# Patient Record
Sex: Female | Born: 1960
Health system: Southern US, Community
[De-identification: ages and names within clinical notes are randomized; demographics above are authoritative.]

## PROBLEM LIST (undated history)

## (undated) DIAGNOSIS — Z820 Family history of epilepsy and other diseases of the nervous system: Secondary | ICD-10-CM

## (undated) DIAGNOSIS — R569 Unspecified convulsions: Secondary | ICD-10-CM

## (undated) HISTORY — PX: OTHER SURGICAL HISTORY: SHX169

## (undated) HISTORY — PX: ANKLE SURGERY: SHX546

---

## 2012-02-13 DIAGNOSIS — R5383 Other fatigue: Secondary | ICD-10-CM | POA: Diagnosis not present

## 2012-02-13 DIAGNOSIS — M7989 Other specified soft tissue disorders: Secondary | ICD-10-CM | POA: Diagnosis not present

## 2012-02-13 DIAGNOSIS — R7989 Other specified abnormal findings of blood chemistry: Secondary | ICD-10-CM | POA: Diagnosis not present

## 2012-02-16 DIAGNOSIS — R569 Unspecified convulsions: Secondary | ICD-10-CM | POA: Diagnosis not present

## 2014-01-30 DIAGNOSIS — R569 Unspecified convulsions: Secondary | ICD-10-CM | POA: Diagnosis not present

## 2014-01-30 DIAGNOSIS — G808 Other cerebral palsy: Secondary | ICD-10-CM | POA: Diagnosis not present

## 2014-02-01 DIAGNOSIS — M19079 Primary osteoarthritis, unspecified ankle and foot: Secondary | ICD-10-CM | POA: Diagnosis not present

## 2014-02-01 DIAGNOSIS — M659 Synovitis and tenosynovitis, unspecified: Secondary | ICD-10-CM | POA: Diagnosis not present

## 2014-02-01 DIAGNOSIS — R569 Unspecified convulsions: Secondary | ICD-10-CM | POA: Diagnosis not present

## 2014-02-02 DIAGNOSIS — M19079 Primary osteoarthritis, unspecified ankle and foot: Secondary | ICD-10-CM | POA: Diagnosis not present

## 2014-02-02 DIAGNOSIS — G809 Cerebral palsy, unspecified: Secondary | ICD-10-CM | POA: Diagnosis not present

## 2014-04-28 DIAGNOSIS — G40309 Generalized idiopathic epilepsy and epileptic syndromes, not intractable, without status epilepticus: Secondary | ICD-10-CM | POA: Diagnosis not present

## 2014-04-28 DIAGNOSIS — E871 Hypo-osmolality and hyponatremia: Secondary | ICD-10-CM | POA: Diagnosis not present

## 2014-04-28 DIAGNOSIS — R51 Headache: Secondary | ICD-10-CM | POA: Diagnosis not present

## 2014-04-28 DIAGNOSIS — R569 Unspecified convulsions: Secondary | ICD-10-CM | POA: Diagnosis not present

## 2014-04-28 DIAGNOSIS — R110 Nausea: Secondary | ICD-10-CM | POA: Diagnosis not present

## 2014-05-09 DIAGNOSIS — R569 Unspecified convulsions: Secondary | ICD-10-CM | POA: Diagnosis not present

## 2014-05-09 DIAGNOSIS — G809 Cerebral palsy, unspecified: Secondary | ICD-10-CM | POA: Diagnosis not present

## 2014-05-23 DIAGNOSIS — G40909 Epilepsy, unspecified, not intractable, without status epilepticus: Secondary | ICD-10-CM | POA: Diagnosis not present

## 2014-08-29 ENCOUNTER — Ambulatory Visit: Payer: Self-pay | Admitting: Internal Medicine

## 2014-08-29 DIAGNOSIS — M79632 Pain in left forearm: Secondary | ICD-10-CM | POA: Diagnosis not present

## 2014-08-29 DIAGNOSIS — S5292XA Unspecified fracture of left forearm, initial encounter for closed fracture: Secondary | ICD-10-CM | POA: Diagnosis not present

## 2014-08-29 DIAGNOSIS — Z8669 Personal history of other diseases of the nervous system and sense organs: Secondary | ICD-10-CM | POA: Diagnosis not present

## 2014-08-29 DIAGNOSIS — Z0001 Encounter for general adult medical examination with abnormal findings: Secondary | ICD-10-CM | POA: Diagnosis not present

## 2014-08-29 DIAGNOSIS — M7989 Other specified soft tissue disorders: Secondary | ICD-10-CM | POA: Diagnosis not present

## 2014-08-29 DIAGNOSIS — S59912A Unspecified injury of left forearm, initial encounter: Secondary | ICD-10-CM | POA: Diagnosis not present

## 2014-08-31 DIAGNOSIS — S5292XA Unspecified fracture of left forearm, initial encounter for closed fracture: Secondary | ICD-10-CM | POA: Diagnosis not present

## 2014-09-20 DIAGNOSIS — G40409 Other generalized epilepsy and epileptic syndromes, not intractable, without status epilepticus: Secondary | ICD-10-CM | POA: Diagnosis not present

## 2014-09-20 DIAGNOSIS — Z87898 Personal history of other specified conditions: Secondary | ICD-10-CM | POA: Diagnosis not present

## 2014-10-02 DIAGNOSIS — S5292XA Unspecified fracture of left forearm, initial encounter for closed fracture: Secondary | ICD-10-CM | POA: Diagnosis not present

## 2015-01-08 DIAGNOSIS — L255 Unspecified contact dermatitis due to plants, except food: Secondary | ICD-10-CM | POA: Diagnosis not present

## 2015-09-19 DIAGNOSIS — G40409 Other generalized epilepsy and epileptic syndromes, not intractable, without status epilepticus: Secondary | ICD-10-CM | POA: Diagnosis not present

## 2015-09-19 DIAGNOSIS — Z87898 Personal history of other specified conditions: Secondary | ICD-10-CM | POA: Diagnosis not present

## 2015-09-28 ENCOUNTER — Encounter: Payer: Self-pay | Admitting: Emergency Medicine

## 2015-09-28 ENCOUNTER — Emergency Department
Admission: EM | Admit: 2015-09-28 | Discharge: 2015-09-28 | Disposition: A | Discharge status: Home / Self Care | Payer: Medicare Other | Attending: Emergency Medicine | Admitting: Emergency Medicine

## 2015-09-28 DIAGNOSIS — L02411 Cutaneous abscess of right axilla: Secondary | ICD-10-CM | POA: Diagnosis not present

## 2015-09-28 DIAGNOSIS — L02412 Cutaneous abscess of left axilla: Secondary | ICD-10-CM | POA: Insufficient documentation

## 2015-09-28 HISTORY — DX: Unspecified convulsions: R56.9

## 2015-09-28 MED ORDER — SULFAMETHOXAZOLE-TRIMETHOPRIM 800-160 MG PO TABS: 1.0000 | ORAL_TABLET | Freq: Two times a day (BID) | ORAL | Status: DC

## 2015-09-28 MED ORDER — LIDOCAINE HCL (PF) 1 % IJ SOLN
5.0000 mL | Freq: Once | INTRAMUSCULAR | Status: AC
  Administered 2015-09-28: 5 mL

## 2015-09-28 MED ORDER — OXYCODONE-ACETAMINOPHEN 5-325 MG PO TABS: 1.0000 | ORAL_TABLET | ORAL | Status: DC | PRN

## 2015-09-28 MED ORDER — OXYCODONE-ACETAMINOPHEN 5-325 MG PO TABS
1.0000 | ORAL_TABLET | Freq: Once | ORAL | Status: AC
  Administered 2015-09-28: 1 via ORAL
  Filled 2015-09-28: qty 1

## 2015-09-28 MED ORDER — LIDOCAINE HCL (PF) 1 % IJ SOLN
INTRAMUSCULAR | Status: AC
  Administered 2015-09-28: 5 mL
  Filled 2015-09-28: qty 5

## 2015-09-28 NOTE — ED Notes (Signed)
abacesses under both arms.

## 2015-09-28 NOTE — Discharge Instructions (Signed)
Abscess An abscess (boil or furuncle) is an infected area on or under the skin. This area is filled with yellowish-white fluid (pus) and other material (debris). HOME CARE   Only take medicines as told by your doctor.  If you were given antibiotic medicine, take it as directed. Finish the medicine even if you start to feel better.  If gauze is used, follow your doctor's directions for changing the gauze.  To avoid spreading the infection:  Keep your abscess covered with a bandage.  Wash your hands well.  Do not share personal care items, towels, or whirlpools with others.  Avoid skin contact with others.  Keep your skin and clothes clean around the abscess.  Keep all doctor visits as told. GET HELP RIGHT AWAY IF:   You have more pain, puffiness (swelling), or redness in the wound site.  You have more fluid or blood coming from the wound site.  You have muscle aches, chills, or you feel sick.  You have a fever. MAKE SURE YOU:   Understand these instructions.  Will watch your condition.  Will get help right away if you are not doing well or get worse.   This information is not intended to replace advice given to you by your health care provider. Make sure you discuss any questions you have with your health care provider.   Document Released: 04/07/2008 Document Revised: 04/20/2012 Document Reviewed: 01/03/2012 Elsevier Interactive Patient Education [employer] Elsevier Inc[employer]    Return to the emergency room in 2 days for packing removal. Begin taking Percocet as needed for pain and take Bactrim DS twice a day every day until finished.

## 2015-09-28 NOTE — ED Provider Notes (Signed)
[facility] Emergency Department Provider Note ____________________________________________  Time seen: Approximately 12:00 PM  I have reviewed the triage vital signs and the nursing notes.   HISTORY  Chief Complaint Abscess  HPI Brittany Miles is a 54 y.o. female is here with complaint of abscesses under both arms. Patient states that they have gotten worse over the last several days and she is unable to bear the pain anymore. Patient has not been taking any over-the-counter medication for pain. She denies any previous abscesses prior to this. She denies any fever or chills. There's been no nausea or vomiting. Currently she rates her pain an 8 out of 10 which increases with movement of her arms.   Past Medical History  Diagnosis Date  . Seizures (HCC)     There are no active problems to display for this patient.   No past surgical history on file.  Current Outpatient Rx  Name  Route  Sig  Dispense  Refill  . oxyCODONE-acetaminophen (PERCOCET) 5-325 MG tablet   Oral   Take 1 tablet by mouth every 4 (four) hours as needed for severe pain.   20 tablet   0   . sulfamethoxazole-trimethoprim (BACTRIM DS,SEPTRA DS) 800-160 MG tablet   Oral   Take 1 tablet by mouth 2 (two) times daily.   20 tablet   0     Allergies Review of patient's allergies indicates no known allergies.  No family history on file.  Social History Social History  Substance Use Topics  . Smoking status: Never Smoker   . Smokeless tobacco: None  . Alcohol Use: No    Review of Systems Constitutional: No fever/chills Cardiovascular: Denies chest pain. Respiratory: Denies shortness of breath. Gastrointestinal:  No nausea, no vomiting.  Genitourinary: Negative for dysuria. Musculoskeletal: Negative for back pain. Skin: Positive for abscesses 2 Neurological: Negative for headaches, focal weakness or numbness.  10-point ROS otherwise  negative.  ____________________________________________   PHYSICAL EXAM:  VITAL SIGNS: ED Triage Vitals  Enc Vitals Group     BP 09/28/15 1107 112/73 mmHg     Pulse Rate 09/28/15 1107 73     Resp --      Temp 09/28/15 1107 97.8 F (36.6 C)     Temp Source 09/28/15 1107 Oral     SpO2 09/28/15 1107 100 %     Weight 09/28/15 1107 160 lb (72.576 kg)     Height 09/28/15 1107  (1.6 m)     Head Cir --      Peak Flow --      Pain Score 09/28/15 1118 8     Pain Loc --      Pain Edu? --      Excl. in GC? --     Constitutional: Alert and oriented. Well appearing and in no acute distress. Eyes: Conjunctivae are normal. PERRL. EOMI. Head: Atraumatic. Nose: No congestion/rhinnorhea. Neck: No stridor.   Cardiovascular: Normal rate, regular rhythm. Grossly normal heart sounds.  Good peripheral circulation. Respiratory: Normal respiratory effort.  No retractions. Lungs CTAB. Gastrointestinal: Soft and nontender. No distention. Musculoskeletal: No lower extremity tenderness nor edema.  No joint effusions. Neurologic:  Normal speech and language. No gross focal neurologic deficits are appreciated. No gait instability. Skin:  Skin is warm, dry and intact. Bilateral axilla with abscesses present. Marked tenderness on palpation. Left axilla has a pustule present with some extending cellulitis. Right axilla single fluctuant area approximately 2 cm. No active drainage noted. Psychiatric: Mood and affect  are normal. Speech and behavior are normal.  ____________________________________________   LABS (all labs ordered are listed, but only abnormal results are displayed)  Labs Reviewed - No data to display  PROCEDURES  Procedure(s) performed: INCISION AND DRAINAGE Performed by: Tommi Rumpshonda L Laiyla Slagel Consent: Verbal consent obtained. Risks and benefits: risks, benefits and alternatives were discussed Type: abscess  Body area:bilateral axillae ansthesia: local infiltration  Incision was  made with a scalpel.  Local anesthetic: lidocaine 1 % without epinephrine  Anesthetic total:  3.20ml  area probed Drainage: purulent  Drainage amount: Moderate purulent drainage from the right axilla. Minimal purulent drainage from the left axilla.   Packing material: 1/4 in iodoform gauze right axilla only  Patient tolerance: Patient tolerated the procedure well with no immediate complications.    Critical Care performed: No  ____________________________________________   INITIAL IMPRESSION / ASSESSMENT AND PLAN / ED COURSE  Pertinent labs & imaging results that were available during my care of the patient were reviewed by me and considered in my medical decision making (see chart for details).  Patient was given Percocet prior to procedure and tolerated both axilla well. Patient was discharged with prescription for Bactrim DS 1 twice a day for 10 days along with Percocet as needed for pain. She will return in 2 days for removal of packing from the right axilla. ____________________________________________   FINAL CLINICAL IMPRESSION(S) / ED DIAGNOSES  Final diagnoses:  Cutaneous abscess of right axilla  Cutaneous abscess of left axilla      Tommi RumpsRhonda L Ethylene Reznick, PA-C 09/28/15 1451  Phineas SemenGraydon Goodman, MD 09/28/15 1540

## 2015-09-30 ENCOUNTER — Emergency Department
Admission: EM | Admit: 2015-09-30 | Discharge: 2015-09-30 | Disposition: A | Discharge status: Home / Self Care | Payer: Medicare Other | Attending: Emergency Medicine | Admitting: Emergency Medicine

## 2015-09-30 ENCOUNTER — Encounter: Payer: Self-pay | Admitting: Emergency Medicine

## 2015-09-30 DIAGNOSIS — Z792 Long term (current) use of antibiotics: Secondary | ICD-10-CM | POA: Diagnosis not present

## 2015-09-30 DIAGNOSIS — L02411 Cutaneous abscess of right axilla: Secondary | ICD-10-CM | POA: Diagnosis not present

## 2015-09-30 DIAGNOSIS — Z4801 Encounter for change or removal of surgical wound dressing: Secondary | ICD-10-CM | POA: Diagnosis not present

## 2015-09-30 DIAGNOSIS — L02412 Cutaneous abscess of left axilla: Secondary | ICD-10-CM | POA: Insufficient documentation

## 2015-09-30 DIAGNOSIS — Z09 Encounter for follow-up examination after completed treatment for conditions other than malignant neoplasm: Secondary | ICD-10-CM

## 2015-09-30 DIAGNOSIS — Z880 Allergy status to penicillin: Secondary | ICD-10-CM | POA: Diagnosis not present

## 2015-09-30 DIAGNOSIS — L02419 Cutaneous abscess of limb, unspecified: Secondary | ICD-10-CM

## 2015-09-30 DIAGNOSIS — Z4800 Encounter for change or removal of nonsurgical wound dressing: Secondary | ICD-10-CM | POA: Diagnosis not present

## 2015-09-30 NOTE — ED Provider Notes (Signed)
[facility] Emergency Department Provider Note  ____________________________________________  Time seen: Approximately 10:45 AM  I have reviewed the triage vital signs and the nursing notes.   HISTORY  Chief Complaint Wound Check    HPI Brittany Miles is a 54 y.o. female who presents emergency department for I&D recheck.Patient was seen in the emergency department 2 days prior with I&D of abscesses in both axillas. Patient reports that she has been taking antibiotics as prescribed and reports improvement in symptoms. Patient states there is still mild drainage from both. She has kept areas covered with dressings. She denies any fevers or chills, nausea or vomiting, chest pain or shortness of breath.   Past Medical History  Diagnosis Date  . Seizures (HCC)     There are no active problems to display for this patient.   History reviewed. No pertinent past surgical history.  Current Outpatient Rx  Name  Route  Sig  Dispense  Refill  . oxyCODONE-acetaminophen (PERCOCET) 5-325 MG tablet   Oral   Take 1 tablet by mouth every 4 (four) hours as needed for severe pain.   20 tablet   0   . sulfamethoxazole-trimethoprim (BACTRIM DS,SEPTRA DS) 800-160 MG tablet   Oral   Take 1 tablet by mouth 2 (two) times daily.   20 tablet   0     Allergies Codeine and Penicillins  No family history on file.  Social History Social History  Substance Use Topics  . Smoking status: Never Smoker   . Smokeless tobacco: None  . Alcohol Use: No    Review of Systems Constitutional: No fever/chills Eyes: No visual changes. ENT: No sore throat. Cardiovascular: Denies chest pain. Respiratory: Denies shortness of breath. Gastrointestinal: No abdominal pain.  No nausea, no vomiting.  No diarrhea.  No constipation. Genitourinary: Negative for dysuria. Musculoskeletal: Negative for back pain. Skin: Negative for rash. Endorses abscesses bilateral axilla. Neurological:  Negative for headaches, focal weakness or numbness.  10-point ROS otherwise negative.  ____________________________________________   PHYSICAL EXAM:  VITAL SIGNS: ED Triage Vitals  Enc Vitals Group     BP 09/30/15 1026 105/70 mmHg     Pulse Rate 09/30/15 1026 77     Resp 09/30/15 1026 16     Temp 09/30/15 1026 98.4 F (36.9 C)     Temp Source 09/30/15 1026 Oral     SpO2 09/30/15 1026 99 %     Weight 09/30/15 1026 160 lb (72.576 kg)     Height 09/30/15 1026 5\' 2"  (1.575 m)     Head Cir --      Peak Flow --      Pain Score 09/30/15 1028 6     Pain Loc --      Pain Edu? --      Excl. in GC? --     Constitutional: Alert and oriented. Well appearing and in no acute distress. Eyes: Conjunctivae are normal. PERRL. EOMI. Head: Atraumatic. Nose: No congestion/rhinnorhea. Mouth/Throat: Mucous membranes are moist.  Oropharynx non-erythematous. Neck: No stridor.   Cardiovascular: Normal rate, regular rhythm. Grossly normal heart sounds.  Good peripheral circulation. Respiratory: Normal respiratory effort.  No retractions. Lungs CTAB. Gastrointestinal: Soft and nontender. No distention. No abdominal bruits. No CVA tenderness. Musculoskeletal: No lower extremity tenderness nor edema.  No joint effusions. Neurologic:  Normal speech and language. No gross focal neurologic deficits are appreciated. No gait instability. Skin:  Skin is warm, dry and intact. No rash noted. Incised and drained abscess in both  axillas. There is still edema and minimal erythema to right axilla. Packing is in place. Minimal purulent drainage noted on packing. Area is firm to palpation with no fluctuance. Patient reports minimal tenderness to palpation. No expressed pus to palpation. Left axilla reveals no packing. Minimal purulent drainage noted to the incision opening. Area is firm to palpation with no fluctuance. No expressed pus with palpation. Psychiatric: Mood and affect are normal. Speech and behavior are  normal.  ____________________________________________   LABS (all labs ordered are listed, but only abnormal results are displayed)  Labs Reviewed - No data to display ____________________________________________  EKG   ____________________________________________  RADIOLOGY   ____________________________________________   PROCEDURES  Procedure(s) performed: None  Critical Care performed: No  ____________________________________________   INITIAL IMPRESSION / ASSESSMENT AND PLAN / ED COURSE  Pertinent labs & imaging results that were available during my care of the patient were reviewed by me and considered in my medical decision making (see chart for details).  The patient presents emergency department for wound recheck from I&D's in this department 2 days prior. Patient is showing signs of improvement. There is were evaluated with no increase in symptoms. No increase in purulent drainage. Packing is removed from right and not replaced. Areas are covered with gauze and Tegaderm. Advised patient to continue to cover areas and take antibiotics. Patient verbalizes understanding of diagnosis and treatment plan and verbalizes compliance with same. ____________________________________________   FINAL CLINICAL IMPRESSION(S) / ED DIAGNOSES  Final diagnoses:  Encounter for recheck of abscess following incision and drainage  Axillary abscess      Racheal Patches, PA-C 09/30/15 1106  Phineas Semen, MD 09/30/15 1252

## 2015-09-30 NOTE — ED Notes (Signed)
Patient had I & D of abscess on both underarms done on Friday, here today for recheck. Patient denies any fevers or chills. Patient reports clear discharge from right incision area.

## 2015-09-30 NOTE — ED Notes (Signed)
Patient presents to the ED for a wound check after having abscess in both axillary areas lanced.  Patient is in no obvious distress at this time.  Patient reports continuing to have some pain in axilla but pain has improved somewhat since having areas lanced.

## 2015-09-30 NOTE — Discharge Instructions (Signed)
Incision and Drainage Incision and drainage is a procedure in which a sac-like structure (cystic structure) is opened and drained. The area to be drained usually contains material such as pus, fluid, or blood.  LET YOUR CAREGIVER KNOW ABOUT:   Allergies to medicine.  Medicines taken, including vitamins, herbs, eyedrops, over-the-counter medicines, and creams.  Use of steroids (by mouth or creams).  Previous problems with anesthetics or numbing medicines.  History of bleeding problems or blood clots.  Previous surgery.  Other health problems, including diabetes and kidney problems.  Possibility of pregnancy, if this applies. RISKS AND COMPLICATIONS  Pain.  Bleeding.  Scarring.  Infection. BEFORE THE PROCEDURE  You may need to have an ultrasound or other imaging tests to see how large or deep your cystic structure is. Blood tests may also be used to determine if you have an infection or how severe the infection is. You may need to have a tetanus shot. PROCEDURE  The affected area is cleaned with a cleaning fluid. The cyst area will then be numbed with a medicine (local anesthetic). A small incision will be made in the cystic structure. A syringe or catheter may be used to drain the contents of the cystic structure, or the contents may be squeezed out. The area will then be flushed with a cleansing solution. After cleansing the area, it is often gently packed with a gauze or another wound dressing. Once it is packed, it will be covered with gauze and tape or some other type of wound dressing. AFTER THE PROCEDURE   Often, you will be allowed to go home right after the procedure.  You may be given antibiotic medicine to prevent or heal an infection.  If the area was packed with gauze or some other wound dressing, you will likely need to come back in 1 to 2 days to get it removed.  The area should heal in about 14 days.   This information is not intended to replace advice given  to you by your health care provider. Make sure you discuss any questions you have with your health care provider.   Document Released: 04/15/2001 Document Revised: 04/20/2012 Document Reviewed: 12/15/2011 Elsevier Interactive Patient Education 2016 Elsevier Inc.  Wound Care Taking care of your wound properly can help to prevent pain and infection. It can also help your wound to heal more quickly.  HOW TO CARE FOR YOUR WOUND  Take or apply over-the-counter and prescription medicines only as told by your health care provider.  If you were prescribed antibiotic medicine, take or apply it as told by your health care provider. Do not stop using the antibiotic even if your condition improves.  Clean the wound each day or as told by your health care provider.  Wash the wound with mild soap and water.  Rinse the wound with water to remove all soap.  Pat the wound dry with a clean towel. Do not rub it.  There are many different ways to close and cover a wound. For example, a wound can be covered with stitches (sutures), skin glue, or adhesive strips. Follow instructions from your health care provider about:  How to take care of your wound.  When and how you should change your bandage (dressing).  When you should remove your dressing.  Removing whatever was used to close your wound.  Check your wound every day for signs of infection. Watch for:  Redness, swelling, or pain.  Fluid, blood, or pus.  Keep the dressing dry  until your health care provider says it can be removed. Do not take baths, swim, use a hot tub, or do anything that would put your wound underwater until your health care provider approves.  Raise (elevate) the injured area above the level of your heart while you are sitting or lying down.  Do not scratch or pick at the wound.  Keep all follow-up visits as told by your health care provider. This is important. SEEK MEDICAL CARE IF:  You received a tetanus shot and  you have swelling, severe pain, redness, or bleeding at the injection site.  You have a fever.  Your pain is not controlled with medicine.  You have increased redness, swelling, or pain at the site of your wound.  You have fluid, blood, or pus coming from your wound.  You notice a bad smell coming from your wound or your dressing. SEEK IMMEDIATE MEDICAL CARE IF:  You have a red streak going away from your wound.   This information is not intended to replace advice given to you by your health care provider. Make sure you discuss any questions you have with your health care provider.   Document Released: 07/29/2008 Document Revised: 03/06/2015 Document Reviewed: 10/16/2014 Elsevier Interactive Patient Education [employer]

## 2015-10-05 DIAGNOSIS — G40409 Other generalized epilepsy and epileptic syndromes, not intractable, without status epilepticus: Secondary | ICD-10-CM | POA: Diagnosis not present

## 2015-11-20 DIAGNOSIS — B9562 Methicillin resistant Staphylococcus aureus infection as the cause of diseases classified elsewhere: Secondary | ICD-10-CM | POA: Diagnosis not present

## 2015-11-20 DIAGNOSIS — L02419 Cutaneous abscess of limb, unspecified: Secondary | ICD-10-CM | POA: Diagnosis not present

## 2015-11-20 DIAGNOSIS — L0390 Cellulitis, unspecified: Secondary | ICD-10-CM | POA: Diagnosis not present

## 2015-11-20 DIAGNOSIS — G40909 Epilepsy, unspecified, not intractable, without status epilepticus: Secondary | ICD-10-CM | POA: Diagnosis not present

## 2015-11-27 DIAGNOSIS — B9562 Methicillin resistant Staphylococcus aureus infection as the cause of diseases classified elsewhere: Secondary | ICD-10-CM | POA: Diagnosis not present

## 2015-11-27 DIAGNOSIS — G809 Cerebral palsy, unspecified: Secondary | ICD-10-CM | POA: Diagnosis not present

## 2015-11-27 DIAGNOSIS — L0390 Cellulitis, unspecified: Secondary | ICD-10-CM | POA: Diagnosis not present

## 2015-12-11 DIAGNOSIS — Z8669 Personal history of other diseases of the nervous system and sense organs: Secondary | ICD-10-CM | POA: Diagnosis not present

## 2015-12-11 DIAGNOSIS — G40909 Epilepsy, unspecified, not intractable, without status epilepticus: Secondary | ICD-10-CM | POA: Diagnosis not present

## 2015-12-11 DIAGNOSIS — L0390 Cellulitis, unspecified: Secondary | ICD-10-CM | POA: Diagnosis not present

## 2015-12-11 DIAGNOSIS — B9562 Methicillin resistant Staphylococcus aureus infection as the cause of diseases classified elsewhere: Secondary | ICD-10-CM | POA: Diagnosis not present

## 2016-03-18 DIAGNOSIS — G40409 Other generalized epilepsy and epileptic syndromes, not intractable, without status epilepticus: Secondary | ICD-10-CM | POA: Diagnosis not present

## 2016-03-18 DIAGNOSIS — Z87898 Personal history of other specified conditions: Secondary | ICD-10-CM | POA: Diagnosis not present

## 2016-07-16 DIAGNOSIS — G40909 Epilepsy, unspecified, not intractable, without status epilepticus: Secondary | ICD-10-CM | POA: Diagnosis not present

## 2016-07-16 DIAGNOSIS — L0390 Cellulitis, unspecified: Secondary | ICD-10-CM | POA: Diagnosis not present

## 2016-07-16 DIAGNOSIS — Z8669 Personal history of other diseases of the nervous system and sense organs: Secondary | ICD-10-CM | POA: Diagnosis not present

## 2016-07-17 DIAGNOSIS — R5381 Other malaise: Secondary | ICD-10-CM | POA: Diagnosis not present

## 2016-07-17 DIAGNOSIS — E784 Other hyperlipidemia: Secondary | ICD-10-CM | POA: Diagnosis not present

## 2016-07-17 DIAGNOSIS — I10 Essential (primary) hypertension: Secondary | ICD-10-CM | POA: Diagnosis not present

## 2016-07-21 DIAGNOSIS — Z8669 Personal history of other diseases of the nervous system and sense organs: Secondary | ICD-10-CM | POA: Diagnosis not present

## 2016-07-21 DIAGNOSIS — G40909 Epilepsy, unspecified, not intractable, without status epilepticus: Secondary | ICD-10-CM | POA: Diagnosis not present

## 2016-07-21 DIAGNOSIS — R29818 Other symptoms and signs involving the nervous system: Secondary | ICD-10-CM | POA: Diagnosis not present

## 2016-10-20 DIAGNOSIS — G40909 Epilepsy, unspecified, not intractable, without status epilepticus: Secondary | ICD-10-CM | POA: Diagnosis not present

## 2016-10-20 DIAGNOSIS — Z8669 Personal history of other diseases of the nervous system and sense organs: Secondary | ICD-10-CM | POA: Diagnosis not present

## 2016-10-22 ENCOUNTER — Other Ambulatory Visit
Admission: RE | Admit: 2016-10-22 | Discharge: 2016-10-22 | Disposition: A | Discharge status: Home / Self Care | Payer: Medicare Other | Source: Ambulatory Visit | Attending: Internal Medicine | Admitting: Internal Medicine

## 2016-10-22 DIAGNOSIS — G909 Disorder of the autonomic nervous system, unspecified: Secondary | ICD-10-CM | POA: Insufficient documentation

## 2016-10-22 DIAGNOSIS — G809 Cerebral palsy, unspecified: Secondary | ICD-10-CM | POA: Diagnosis present

## 2016-10-22 DIAGNOSIS — G40901 Epilepsy, unspecified, not intractable, with status epilepticus: Secondary | ICD-10-CM | POA: Diagnosis not present

## 2016-10-22 LAB — LIPID PANEL
Cholesterol: 227 mg/dL — ABNORMAL HIGH (ref 0–200)
HDL: 90 mg/dL (ref >40)
LDL Cholesterol: 130 mg/dL — ABNORMAL HIGH (ref 0–99)
Total CHOL/HDL Ratio: 2.5 RATIO
Triglycerides: 34 mg/dL (ref <150)
VLDL: 7 mg/dL (ref 0–40)

## 2016-10-22 LAB — BASIC METABOLIC PANEL
Anion gap: 8 (ref 5–15)
BUN: 10 mg/dL (ref 6–20)
CO2: 26 mmol/L (ref 22–32)
Calcium: 9 mg/dL (ref 8.9–10.3)
Chloride: 96 mmol/L — ABNORMAL LOW (ref 101–111)
Creatinine, Ser: 0.63 mg/dL (ref 0.44–1.00)
GFR calc Af Amer: >60 mL/min (ref >60)
GFR calc non Af Amer: >60 mL/min (ref >60)
Glucose, Bld: 86 mg/dL (ref 65–99)
Potassium: 4 mmol/L (ref 3.5–5.1)
Sodium: 130 mmol/L — ABNORMAL LOW (ref 135–145)

## 2016-10-22 LAB — CBC
HCT: 36.6 % (ref 35.0–47.0)
Hemoglobin: 12.6 g/dL (ref 12.0–16.0)
MCH: 29.6 pg (ref 26.0–34.0)
MCHC: 34.4 g/dL (ref 32.0–36.0)
MCV: 86 fL (ref 80.0–100.0)
Platelets: 273 10*3/uL (ref 150–440)
RBC: 4.26 MIL/uL (ref 3.80–5.20)
RDW: 13.2 % (ref 11.5–14.5)
WBC: 3.6 10*3/uL (ref 3.6–11.0)

## 2016-10-22 LAB — TSH: TSH: 2.234 u[IU]/mL (ref 0.350–4.500)

## 2016-11-04 DIAGNOSIS — G40909 Epilepsy, unspecified, not intractable, without status epilepticus: Secondary | ICD-10-CM | POA: Diagnosis not present

## 2016-11-04 DIAGNOSIS — Z8669 Personal history of other diseases of the nervous system and sense organs: Secondary | ICD-10-CM | POA: Diagnosis not present

## 2016-11-04 DIAGNOSIS — S5292XA Unspecified fracture of left forearm, initial encounter for closed fracture: Secondary | ICD-10-CM | POA: Diagnosis not present

## 2016-11-04 DIAGNOSIS — R29818 Other symptoms and signs involving the nervous system: Secondary | ICD-10-CM | POA: Diagnosis not present

## 2017-03-04 DIAGNOSIS — Z8669 Personal history of other diseases of the nervous system and sense organs: Secondary | ICD-10-CM | POA: Diagnosis not present

## 2017-03-04 DIAGNOSIS — S5292XA Unspecified fracture of left forearm, initial encounter for closed fracture: Secondary | ICD-10-CM | POA: Diagnosis not present

## 2017-03-04 DIAGNOSIS — Z885 Allergy status to narcotic agent status: Secondary | ICD-10-CM | POA: Diagnosis not present

## 2017-03-04 DIAGNOSIS — R29818 Other symptoms and signs involving the nervous system: Secondary | ICD-10-CM | POA: Diagnosis not present

## 2017-10-01 DIAGNOSIS — R29818 Other symptoms and signs involving the nervous system: Secondary | ICD-10-CM | POA: Diagnosis not present

## 2017-10-01 DIAGNOSIS — G40909 Epilepsy, unspecified, not intractable, without status epilepticus: Secondary | ICD-10-CM | POA: Diagnosis not present

## 2017-10-01 DIAGNOSIS — Z8669 Personal history of other diseases of the nervous system and sense organs: Secondary | ICD-10-CM | POA: Diagnosis not present

## 2017-10-01 DIAGNOSIS — I889 Nonspecific lymphadenitis, unspecified: Secondary | ICD-10-CM | POA: Diagnosis not present

## 2017-10-07 DIAGNOSIS — R569 Unspecified convulsions: Secondary | ICD-10-CM | POA: Diagnosis not present

## 2017-10-15 DIAGNOSIS — Z8669 Personal history of other diseases of the nervous system and sense organs: Secondary | ICD-10-CM | POA: Diagnosis not present

## 2017-10-15 DIAGNOSIS — R29818 Other symptoms and signs involving the nervous system: Secondary | ICD-10-CM | POA: Diagnosis not present

## 2017-10-15 DIAGNOSIS — G40909 Epilepsy, unspecified, not intractable, without status epilepticus: Secondary | ICD-10-CM | POA: Diagnosis not present

## 2017-10-15 DIAGNOSIS — I889 Nonspecific lymphadenitis, unspecified: Secondary | ICD-10-CM | POA: Diagnosis not present

## 2018-09-03 DIAGNOSIS — R29818 Other symptoms and signs involving the nervous system: Secondary | ICD-10-CM | POA: Diagnosis not present

## 2018-09-03 DIAGNOSIS — Z8669 Personal history of other diseases of the nervous system and sense organs: Secondary | ICD-10-CM | POA: Diagnosis not present

## 2018-09-03 DIAGNOSIS — Z0000 Encounter for general adult medical examination without abnormal findings: Secondary | ICD-10-CM | POA: Diagnosis not present

## 2018-09-03 DIAGNOSIS — G40909 Epilepsy, unspecified, not intractable, without status epilepticus: Secondary | ICD-10-CM | POA: Diagnosis not present

## 2018-09-03 DIAGNOSIS — I889 Nonspecific lymphadenitis, unspecified: Secondary | ICD-10-CM | POA: Diagnosis not present

## 2018-09-08 ENCOUNTER — Other Ambulatory Visit: Payer: Self-pay

## 2018-09-08 DIAGNOSIS — Z1211 Encounter for screening for malignant neoplasm of colon: Secondary | ICD-10-CM

## 2018-09-09 ENCOUNTER — Other Ambulatory Visit: Payer: Self-pay | Admitting: Internal Medicine

## 2018-09-09 DIAGNOSIS — Z1231 Encounter for screening mammogram for malignant neoplasm of breast: Secondary | ICD-10-CM

## 2018-09-20 ENCOUNTER — Encounter: Payer: Self-pay | Admitting: *Deleted

## 2018-09-21 ENCOUNTER — Encounter: Payer: Self-pay | Admitting: *Deleted

## 2018-09-21 ENCOUNTER — Ambulatory Visit: Payer: Medicare Other | Admitting: Anesthesiology

## 2018-09-21 ENCOUNTER — Ambulatory Visit
Admission: RE | Admit: 2018-09-21 | Discharge: 2018-09-21 | Disposition: A | Discharge status: Home / Self Care | Payer: Medicare Other | Source: Ambulatory Visit | Attending: Gastroenterology | Admitting: Gastroenterology

## 2018-09-21 ENCOUNTER — Encounter
Admission: RE | Disposition: A | Discharge status: Home / Self Care | Payer: Self-pay | Source: Ambulatory Visit | Attending: Gastroenterology

## 2018-09-21 DIAGNOSIS — Z820 Family history of epilepsy and other diseases of the nervous system: Secondary | ICD-10-CM | POA: Insufficient documentation

## 2018-09-21 DIAGNOSIS — Z79899 Other long term (current) drug therapy: Secondary | ICD-10-CM | POA: Diagnosis not present

## 2018-09-21 DIAGNOSIS — Z880 Allergy status to penicillin: Secondary | ICD-10-CM | POA: Diagnosis not present

## 2018-09-21 DIAGNOSIS — Z885 Allergy status to narcotic agent status: Secondary | ICD-10-CM | POA: Insufficient documentation

## 2018-09-21 DIAGNOSIS — Z1211 Encounter for screening for malignant neoplasm of colon: Secondary | ICD-10-CM | POA: Diagnosis not present

## 2018-09-21 DIAGNOSIS — R569 Unspecified convulsions: Secondary | ICD-10-CM | POA: Diagnosis not present

## 2018-09-21 DIAGNOSIS — E669 Obesity, unspecified: Secondary | ICD-10-CM | POA: Diagnosis not present

## 2018-09-21 DIAGNOSIS — Z6832 Body mass index (BMI) 32.0-32.9, adult: Secondary | ICD-10-CM | POA: Diagnosis not present

## 2018-09-21 HISTORY — PX: COLONOSCOPY WITH PROPOFOL: SHX5780

## 2018-09-21 HISTORY — DX: Family history of epilepsy and other diseases of the nervous system: Z82.0

## 2018-09-21 SURGERY — COLONOSCOPY WITH PROPOFOL
Anesthesia: General

## 2018-09-21 MED ORDER — PROPOFOL 10 MG/ML IV BOLUS
INTRAVENOUS | Status: DC | PRN
  Administered 2018-09-21: 50 mg via INTRAVENOUS

## 2018-09-21 MED ORDER — GLYCOPYRROLATE 0.2 MG/ML IJ SOLN
INTRAMUSCULAR | Status: DC | PRN
  Administered 2018-09-21: 0.2 mg via INTRAVENOUS

## 2018-09-21 MED ORDER — PROPOFOL 500 MG/50ML IV EMUL
INTRAVENOUS | Status: AC
  Filled 2018-09-21: qty 50

## 2018-09-21 MED ORDER — SODIUM CHLORIDE 0.9 % IV SOLN: INTRAVENOUS | Status: DC

## 2018-09-21 MED ORDER — LIDOCAINE HCL (PF) 2 % IJ SOLN
INTRAMUSCULAR | Status: DC | PRN
  Administered 2018-09-21: 60 mg via INTRADERMAL

## 2018-09-21 MED ORDER — PROPOFOL 500 MG/50ML IV EMUL
INTRAVENOUS | Status: DC | PRN
  Administered 2018-09-21: 200 ug/kg/min via INTRAVENOUS

## 2018-09-21 MED ORDER — LACTATED RINGERS IV SOLN
INTRAVENOUS | Status: DC | PRN
  Administered 2018-09-21: 14:00:00 via INTRAVENOUS

## 2018-09-21 NOTE — H&P (Signed)
Arlyss Repressohini R Vanga, MD [address]  [address]  [place], [place] [address]  Main: [phone]  Fax: [phone] Pager: [phone]  Primary Care Physician:  Corky DownsMasoud, Javed, MD Primary Gastroenterologist:  Dr. Arlyss Repressohini R Vanga  Pre-Procedure History & Physical: HPI:  [name] is a 57 y.o. female is here for an colonoscopy.   Past Medical History:  Diagnosis Date  . FH: cerebral palsy   . Seizures (HCC)     Past Surgical History:  Procedure Laterality Date  . dog bite      Prior to Admission medications   Medication Sig Start Date End Date Taking? Authorizing Provider  carbamazepine (TEGRETOL) 200 MG tablet Take 200 mg by mouth 3 (three) times daily.   Yes [provider]  gabapentin (NEURONTIN) 800 MG tablet Take 800 mg by mouth 3 (three) times daily.   Yes [provider]  oxyCODONE-acetaminophen (PERCOCET) 5-325 MG tablet Take 1 tablet by mouth every 4 (four) hours as needed for severe pain. 09/28/15   Tommi RumpsSummers, Rhonda L, PA-C  sulfamethoxazole-trimethoprim (BACTRIM DS,SEPTRA DS) 800-160 MG tablet Take 1 tablet by mouth 2 (two) times daily. 09/28/15   Tommi RumpsSummers, Rhonda L, PA-C    Allergies as of 09/08/2018 - Review Complete 09/28/2015  Allergen Reaction Noted  . Codeine Nausea And Vomiting 09/30/2015  . Penicillins Nausea And Vomiting 09/30/2015    History reviewed. No pertinent family history.  Social History   Socioeconomic History  . Marital status: Single    Spouse name: Not on file  . Number of children: Not on file  . Years of education: Not on file  . Highest education level: Not on file  Occupational History  . Not on file  Social Needs  . Financial resource strain: Not on file  . Food insecurity:    Worry: Not on file    Inability: Not on file  . Transportation needs:    Medical: Not on file    Non-medical: Not on file  Tobacco Use  . Smoking status: Never Smoker  . Smokeless tobacco: Never Used  Substance and Sexual  Activity  . Alcohol use: No  . Drug use: Never  . Sexual activity: Not on file  Lifestyle  . Physical activity:    Days per week: Not on file    Minutes per session: Not on file  . Stress: Not on file  Relationships  . Social connections:    Talks on phone: Not on file    Gets together: Not on file    Attends religious service: Not on file    Active member of club or organization: Not on file    Attends meetings of clubs or organizations: Not on file    Relationship status: Not on file  . Intimate partner violence:    Fear of current or ex partner: Not on file    Emotionally abused: Not on file    Physically abused: Not on file    Forced sexual activity: Not on file  Other Topics Concern  . Not on file  Social History Narrative  . Not on file    Review of Systems: See HPI, otherwise negative ROS  Physical Exam: BP 132/79   Pulse 72   Temp 98.2 F (36.8 C) (Tympanic)   Resp 18   Ht 5\' 2"  (1.575 m)   Wt 81.6 kg   SpO2 100%   BMI 32.92 kg/m  General:   Alert,  pleasant and cooperative in NAD Head:  Normocephalic and  atraumatic. Neck:  Supple; no masses or thyromegaly. Lungs:  Clear throughout to auscultation.    Heart:  Regular rate and rhythm. Abdomen:  Soft, nontender and nondistended. Normal bowel sounds, without guarding, and without rebound.   Neurologic:  Alert and  oriented x4;  grossly normal neurologically.  Impression/Plan: Brittany Miles is here for an colonoscopy to be performed for colon cancer screening  Risks, benefits, limitations, and alternatives regarding  colonoscopy have been reviewed with the patient.  Questions have been answered.  All parties agreeable.   [name], MD  09/21/2018, 1:18 PM

## 2018-09-21 NOTE — Op Note (Addendum)
[facility] Patient Name: Brittany Miles Procedure Date: 09/21/2018 2:12 PM MRN: [id number] Account #: [id number] Date of Birth: 1961-05-02 Admit Type: Outpatient Age: 57 Room: [facility] ENDO ROOM 4 Gender: Female Note Status: Finalized Procedure:            Colonoscopy Indications:          Screening for colorectal malignant neoplasm, This is                        the patient's first colonoscopy Providers:            Lin Landsman MD, MD Referring MD:         Cletis Athens, MD (Referring MD) Medicines:            Monitored Anesthesia Care Complications:        No immediate complications. Estimated blood loss: None. Procedure:            Pre-Anesthesia Assessment:                       - Prior to the procedure, a History and Physical was                        performed, and patient medications and allergies were                        reviewed. The patient is competent. The risks and                        benefits of the procedure and the sedation options and                        risks were discussed with the patient. All questions                        were answered and informed consent was obtained.                        Patient identification and proposed procedure were                        verified by the physician, the nurse, the                        anesthesiologist, the anesthetist and the technician in                        the pre-procedure area in the procedure room in the                        endoscopy suite. Mental Status Examination: alert and                        oriented. Airway Examination: normal oropharyngeal                        airway and neck mobility. Respiratory Examination:                        clear to auscultation. CV Examination: normal.  Prophylactic Antibiotics: The patient does not require                        prophylactic antibiotics. Prior Anticoagulants: The                         patient has taken no previous anticoagulant or                        antiplatelet agents. ASA Grade Assessment: II - A                        patient with mild systemic disease. After reviewing the                        risks and benefits, the patient was deemed in                        satisfactory condition to undergo the procedure. The                        anesthesia plan was to use monitored anesthesia care                        (MAC). Immediately prior to administration of                        medications, the patient was re-assessed for adequacy                        to receive sedatives. The heart rate, respiratory rate,                        oxygen saturations, blood pressure, adequacy of                        pulmonary ventilation, and response to care were                        monitored throughout the procedure. The physical status                        of the patient was re-assessed after the procedure.                       After obtaining informed consent, the colonoscope was                        passed under direct vision. Throughout the procedure,                        the patient's blood pressure, pulse, and oxygen                        saturations were monitored continuously. The                        Colonoscope was introduced through the anus and  advanced to the the terminal ileum, with identification                        of the appendiceal orifice and IC valve. The                        colonoscopy was performed without difficulty. The                        patient tolerated the procedure well. The quality of                        the bowel preparation was evaluated using the BBPS                        [facility] Bowel Preparation Scale) with scores of: Right                        Colon = 3, Transverse Colon = 3 and Left Colon = 3                        (entire mucosa seen well with no residual staining,                         small fragments of stool or opaque liquid). The total                        BBPS score equals 9. Findings:      The perianal and digital rectal examinations were normal. Pertinent       negatives include normal sphincter tone and no palpable rectal lesions.      The terminal ileum appeared normal.      The colon (entire examined portion) appeared normal.      The retroflexed view of the distal rectum and anal verge was normal and       showed no anal or rectal abnormalities. Impression:           - The examined portion of the ileum was normal.                       - The entire examined colon is normal.                       - The distal rectum and anal verge are normal on                        retroflexion view.                       - No specimens collected. Recommendation:       - Discharge patient to home (with escort).                       - Resume previous diet today.                       - Continue present medications.                       - Repeat colonoscopy in 10 years for surveillance. Procedure  Code(s):    --- Professional ---                       Y5486, Colorectal cancer screening; colonoscopy on                        individual not meeting criteria for high risk Diagnosis Code(s):    --- Professional ---                       Z12.11, Encounter for screening for malignant neoplasm                        of colon CPT copyright 2018 American Medical Association. All rights reserved. The codes documented in this report are preliminary and upon coder review may  be revised to meet current compliance requirements. Dr. Ulyess Mort Lin Landsman MD, MD 09/21/2018 2:31:29 PM This report has been signed electronically. Number of Addenda: 0 Note Initiated On: 09/21/2018 2:12 PM Scope Withdrawal Time: 0 hours 9 minutes 59 seconds  Total Procedure Duration: 0 hours 12 minutes 16 seconds       [facility]

## 2018-09-21 NOTE — Anesthesia Preprocedure Evaluation (Signed)
Anesthesia Evaluation  Patient identified by MRN, date of birth, ID band Patient awake    Reviewed: Allergy & Precautions, NPO status , Patient's Chart, lab work & pertinent test results  History of Anesthesia Complications Negative for: history of anesthetic complications  Airway Mallampati: II  TM Distance: >3 FB Neck ROM: Full    Dental no notable dental hx.    Pulmonary neg pulmonary ROS, neg sleep apnea, neg COPD,    breath sounds clear to auscultation- rhonchi (-) wheezing      Cardiovascular Exercise Tolerance: Good (-) hypertension(-) CAD, (-) Past MI, (-) Cardiac Stents and (-) CABG  Rhythm:Regular Rate:Normal - Systolic murmurs and - Diastolic murmurs    Neuro/Psych Seizures -, Well Controlled,  negative psych ROS   GI/Hepatic negative GI ROS, Neg liver ROS,   Endo/Other  negative endocrine ROSneg diabetes  Renal/GU negative Renal ROS     Musculoskeletal negative musculoskeletal ROS (+)   Abdominal (+) + obese,   Peds  Hematology negative hematology ROS (+)   Anesthesia Other Findings Past Medical History: No date: FH: cerebral palsy No date: Seizures (HCC)   Reproductive/Obstetrics                             Anesthesia Physical Anesthesia Plan  ASA: II  Anesthesia Plan: General   Post-op Pain Management:    Induction: Intravenous  PONV Risk Score and Plan: 2 and Propofol infusion  Airway Management Planned: Natural Airway  Additional Equipment:   Intra-op Plan:   Post-operative Plan:   Informed Consent: I have reviewed the patients History and Physical, chart, labs and discussed the procedure including the risks, benefits and alternatives for the proposed anesthesia with the patient or authorized representative who has indicated his/her understanding and acceptance.   Dental advisory given  Plan Discussed with: CRNA and Anesthesiologist  Anesthesia Plan  Comments:         Anesthesia Quick Evaluation

## 2018-09-21 NOTE — Anesthesia Postprocedure Evaluation (Signed)
Anesthesia Post Note  Patient: Brittany SectionCarolyn Miles  Procedure(s) Performed: COLONOSCOPY WITH PROPOFOL (N/A )  Patient location during evaluation: Endoscopy Anesthesia Type: General Level of consciousness: awake and alert and oriented Pain management: pain level controlled Vital Signs Assessment: post-procedure vital signs reviewed and stable Respiratory status: spontaneous breathing, nonlabored ventilation and respiratory function stable Cardiovascular status: blood pressure returned to baseline and stable Postop Assessment: no signs of nausea or vomiting Anesthetic complications: no     Last Vitals:  Vitals:   09/21/18 1434 09/21/18 1444  BP: 125/78 (!) 143/89  Pulse: 85 82  Resp: 20 (!) 23  Temp: (!) 36.2 C   SpO2: 100% 100%    Last Pain:  Vitals:   09/21/18 1444  TempSrc:   PainSc: 0-No pain                 Stellar Gensel

## 2018-09-21 NOTE — Transfer of Care (Signed)
Immediate Anesthesia Transfer of Care Note  Patient: Brittany Miles  Procedure(s) Performed: COLONOSCOPY WITH PROPOFOL (N/A )  Patient Location: PACU  Anesthesia Type:MAC  Level of Consciousness: awake, alert , oriented and patient cooperative  Airway & Oxygen Therapy: Patient Spontanous Breathing and Patient connected to nasal cannula oxygen  Post-op Assessment: Report given to RN and Post -op Vital signs reviewed and stable  Post vital signs: Reviewed and stable  Last Vitals:  Vitals Value Taken Time  BP    Temp    Pulse    Resp    SpO2      Last Pain:  Vitals:   09/21/18 1247  TempSrc: Tympanic  PainSc: 0-No pain      Patients Stated Pain Goal: 0 (69/67/89 3810)  Complications: No apparent anesthesia complications

## 2018-09-21 NOTE — Anesthesia Post-op Follow-up Note (Signed)
Anesthesia QCDR form completed.        

## 2018-09-23 ENCOUNTER — Encounter: Payer: Self-pay | Admitting: Gastroenterology

## 2018-10-07 ENCOUNTER — Ambulatory Visit
Admission: RE | Admit: 2018-10-07 | Discharge: 2018-10-07 | Disposition: A | Discharge status: Home / Self Care | Payer: Medicare Other | Source: Ambulatory Visit | Attending: Internal Medicine | Admitting: Internal Medicine

## 2018-10-07 DIAGNOSIS — Z1231 Encounter for screening mammogram for malignant neoplasm of breast: Secondary | ICD-10-CM | POA: Diagnosis not present

## 2019-01-27 ENCOUNTER — Emergency Department
Admission: EM | Admit: 2019-01-27 | Discharge: 2019-01-27 | Disposition: A | Discharge status: Home / Self Care | Payer: Medicare Other | Attending: Emergency Medicine | Admitting: Emergency Medicine

## 2019-01-27 ENCOUNTER — Encounter: Payer: Self-pay | Admitting: Emergency Medicine

## 2019-01-27 ENCOUNTER — Other Ambulatory Visit: Payer: Self-pay

## 2019-01-27 DIAGNOSIS — B9789 Other viral agents as the cause of diseases classified elsewhere: Secondary | ICD-10-CM | POA: Insufficient documentation

## 2019-01-27 DIAGNOSIS — J069 Acute upper respiratory infection, unspecified: Secondary | ICD-10-CM | POA: Diagnosis not present

## 2019-01-27 DIAGNOSIS — R05 Cough: Secondary | ICD-10-CM | POA: Diagnosis not present

## 2019-01-27 MED ORDER — PSEUDOEPH-BROMPHEN-DM 30-2-10 MG/5ML PO SYRP: 10.0000 mL | ORAL_SOLUTION | Freq: Four times a day (QID) | ORAL | 0 refills | Status: DC | PRN

## 2019-01-27 MED ORDER — CETIRIZINE HCL 10 MG PO CAPS: 10.0000 mg | ORAL_CAPSULE | Freq: Every day | ORAL | 3 refills | Status: DC

## 2019-01-27 NOTE — ED Provider Notes (Signed)
[facility] Emergency Department Provider Note  ____________________________________________  Time seen: Approximately 12:22 PM  I have reviewed the triage vital signs and the nursing notes.   HISTORY  Chief Complaint Cough   HPI Brittany Miles is a 58 y.o. female who presents to the emergency department for treatment and evaluation of cough for the past week with fatigue.  She has a history of bronchitis.  She states that she typically gets these symptoms every year.  No alleviating measures attempted prior to arrival  Past Medical History:  Diagnosis Date  . FH: cerebral palsy   . Seizures [facility])     Patient Active Problem List   Diagnosis Date Noted  . Encounter for screening colonoscopy     Past Surgical History:  Procedure Laterality Date  . COLONOSCOPY WITH PROPOFOL N/A 09/21/2018   Procedure: COLONOSCOPY WITH PROPOFOL;  Surgeon: Toney Reil, MD;  Location: [facility] ENDOSCOPY;  Service: Gastroenterology;  Laterality: N/A;  . dog bite      Prior to Admission medications   Medication Sig Start Date End Date Taking? Authorizing Provider  brompheniramine-pseudoephedrine-DM 30-2-10 MG/5ML syrup Take 10 mLs by mouth 4 (four) times daily as needed. 01/27/19   Ruthe Roemer, Rulon Eisenmenger B, FNP  carbamazepine (TEGRETOL) 200 MG tablet Take 200 mg by mouth 3 (three) times daily.    [provider]  Cetirizine HCl 10 MG CAPS Take 1 capsule (10 mg total) by mouth daily. 01/27/19   Amiri Riechers B, FNP  gabapentin (NEURONTIN) 800 MG tablet Take 800 mg by mouth 3 (three) times daily.    [provider]  oxyCODONE-acetaminophen (PERCOCET) 5-325 MG tablet Take 1 tablet by mouth every 4 (four) hours as needed for severe pain. 09/28/15   Tommi Rumps, PA-C  sulfamethoxazole-trimethoprim (BACTRIM DS,SEPTRA DS) 800-160 MG tablet Take 1 tablet by mouth 2 (two) times daily. 09/28/15   Tommi Rumps, PA-C    Allergies Codeine and  Penicillins  Family History  Problem Relation Age of Onset  . Breast cancer Neg Hx     Social History Social History   Tobacco Use  . Smoking status: Never Smoker  . Smokeless tobacco: Never Used  Substance Use Topics  . Alcohol use: No  . Drug use: Never    Review of Systems Constitutional: Negative for fever/chills.  Normal appetite. ENT: No sore throat. Cardiovascular: Denies chest pain. Respiratory: Negative for shortness of breath.  Positive for cough.  No wheezing.  Gastrointestinal: Negative for nausea, no vomiting.  Negative for diarrhea.  Musculoskeletal: Negative for body aches Skin: Negative for rash. Neurological: Negative for headaches ____________________________________________   PHYSICAL EXAM:  VITAL SIGNS: BP 123/74, temp 98.2, pulse rate 65, respiratory rate 18, SPO2 100% on room air. ED Triage Vitals  Enc Vitals Group     BP      Pulse      Resp      Temp      Temp src      SpO2      Weight      Height      Head Circumference      Peak Flow      Pain Score      Pain Loc      Pain Edu?      Excl. in GC?     Constitutional: Alert and oriented.  Well appearing and in no acute distress. Eyes: Conjunctivae are normal. Nose: No sinus congestion noted; no rhinnorhea. Mouth/Throat: Mucous membranes are moist.  Neck: No stridor.  Lymphatic: No cervical lymphadenopathy. Cardiovascular: Normal rate, regular rhythm. Good peripheral circulation. Respiratory: Respirations are even and unlabored.  No retractions.  Breath sounds clear to auscultation. Gastrointestinal: Soft and nontender.  Musculoskeletal: Ambulatory with unassisted gait Neurologic:  Normal speech and language. Skin:  Skin is warm, dry and intact. No rash noted. Psychiatric: Mood and affect are normal. Speech and behavior are normal.  ____________________________________________   LABS (all labs ordered are listed, but only abnormal results are displayed)  Labs Reviewed - No  data to display ____________________________________________  EKG  Not indicated ____________________________________________  RADIOLOGY  Not indicated ____________________________________________   PROCEDURES  Procedure(s) performed: None  Critical Care performed: No ____________________________________________   INITIAL IMPRESSION / ASSESSMENT AND PLAN / ED COURSE  58 y.o. female who presents to the emergency department for treatment and evaluation of cough and fatigue.  Patient's vital signs are stable and within normal limits including having no fever and no tachycardia.  PERC negative.  Low risk for ACS.  No worrisome travel or sick contacts that would suggest an increased risk of COVID-19.  This patient does not meet criteria for testing currently and I explained that in detail.  The work-up today is reassuring with no evidence of emergent medical condition that requires further work-up or evaluation or inpatient treatment.  I gave my usual and customary follow-up recommendations and return precautions and the patient understands and agrees with the plan.    Medications - No data to display  ED Discharge Orders         Ordered    Cetirizine HCl 10 MG CAPS  Daily     01/27/19 1248    brompheniramine-pseudoephedrine-DM 30-2-10 MG/5ML syrup  4 times daily PRN     01/27/19 1248           Pertinent labs & imaging results that were available during my care of the patient were reviewed by me and considered in my medical decision making (see chart for details).    If controlled substance prescribed during this visit, 12 month history viewed on the [facility] prior to issuing an initial prescription for Schedule II or III opiod. ____________________________________________   FINAL CLINICAL IMPRESSION(S) / ED DIAGNOSES  Final diagnoses:  Viral URI with cough    Note:  This document was prepared using Dragon voice recognition software and may include unintentional  dictation errors.    Chinita Pester, FNP 01/27/19 1500    Don Perking [place], MD 01/29/19 1539

## 2019-01-27 NOTE — ED Triage Notes (Signed)
Pt presents to ED via POV with c/o dry cough x 1 week with fatigue. Pt states "I usually get this cough around this time every year". Pt states hx of bronchitis.

## 2019-06-21 DIAGNOSIS — I10 Essential (primary) hypertension: Secondary | ICD-10-CM | POA: Diagnosis not present

## 2019-06-21 DIAGNOSIS — R5381 Other malaise: Secondary | ICD-10-CM | POA: Diagnosis not present

## 2019-06-21 DIAGNOSIS — G809 Cerebral palsy, unspecified: Secondary | ICD-10-CM | POA: Diagnosis not present

## 2019-06-21 DIAGNOSIS — Z885 Allergy status to narcotic agent status: Secondary | ICD-10-CM | POA: Diagnosis not present

## 2019-06-21 DIAGNOSIS — Z23 Encounter for immunization: Secondary | ICD-10-CM | POA: Diagnosis not present

## 2019-06-21 DIAGNOSIS — R29818 Other symptoms and signs involving the nervous system: Secondary | ICD-10-CM | POA: Diagnosis not present

## 2019-06-30 DIAGNOSIS — S5292XA Unspecified fracture of left forearm, initial encounter for closed fracture: Secondary | ICD-10-CM | POA: Diagnosis not present

## 2019-06-30 DIAGNOSIS — R29818 Other symptoms and signs involving the nervous system: Secondary | ICD-10-CM | POA: Diagnosis not present

## 2019-10-13 ENCOUNTER — Other Ambulatory Visit: Payer: Self-pay

## 2019-10-13 NOTE — Patient Outreach (Signed)
[facility]) Care Management  10/13/2019  Brittany Miles 1960-12-07 [id number]   Medication Adherence call to Mrs. Brittany Miles [facility] Identifiers Verify spoke with patient she is past due on Atorvastatin 20 mg ,patient explain she has stop the medication because she was having side effects. Brittany Miles is showing past due under [place].  [facility] Direct Dial [phone]  Fax [phone] [email or web address]

## 2019-12-19 DIAGNOSIS — G809 Cerebral palsy, unspecified: Secondary | ICD-10-CM | POA: Diagnosis not present

## 2019-12-19 DIAGNOSIS — R29818 Other symptoms and signs involving the nervous system: Secondary | ICD-10-CM | POA: Diagnosis not present

## 2020-02-20 DIAGNOSIS — Z0000 Encounter for general adult medical examination without abnormal findings: Secondary | ICD-10-CM | POA: Diagnosis not present

## 2020-02-20 DIAGNOSIS — R29818 Other symptoms and signs involving the nervous system: Secondary | ICD-10-CM | POA: Diagnosis not present

## 2020-02-20 DIAGNOSIS — M7989 Other specified soft tissue disorders: Secondary | ICD-10-CM | POA: Diagnosis not present

## 2020-07-05 ENCOUNTER — Encounter: Payer: Self-pay | Admitting: Internal Medicine

## 2020-07-05 ENCOUNTER — Ambulatory Visit (INDEPENDENT_AMBULATORY_CARE_PROVIDER_SITE_OTHER): Payer: Medicare Other | Admitting: Internal Medicine

## 2020-07-05 ENCOUNTER — Other Ambulatory Visit: Payer: Self-pay

## 2020-07-05 VITALS — BP 142/87 | HR 69 | Ht 63.0 in | Wt 193.9 lb

## 2020-07-05 DIAGNOSIS — R609 Edema, unspecified: Secondary | ICD-10-CM

## 2020-07-05 DIAGNOSIS — R252 Cramp and spasm: Secondary | ICD-10-CM

## 2020-07-05 DIAGNOSIS — Z0000 Encounter for general adult medical examination without abnormal findings: Secondary | ICD-10-CM

## 2020-07-05 DIAGNOSIS — E8881 Metabolic syndrome: Secondary | ICD-10-CM

## 2020-07-05 NOTE — Progress Notes (Signed)
Established Patient Office Visit  SUBJECTIVE:  Subjective  Patient ID: Brittany Miles, female    DOB: [birth year]  Age: 59 y.o. MRN: [id number]  CC:  Chief Complaint  Patient presents with  . Annual Exam    HPI Brittany Miles is a 59 y.o. female presenting today for an annual exam.   She notes that she fell recently and she hurt her left knee. She states that since then, she has been getting charlie horses in her left leg.   She has no other complaints.    Past Medical History:  Diagnosis Date  . FH: cerebral palsy   . Seizures (HCC)     Past Surgical History:  Procedure Laterality Date  . COLONOSCOPY WITH PROPOFOL N/A 09/21/2018   Procedure: COLONOSCOPY WITH PROPOFOL;  Surgeon: Toney Reil, MD;  Location: [facility] ENDOSCOPY;  Service: Gastroenterology;  Laterality: N/A;  . dog bite      Family History  Problem Relation Age of Onset  . Breast cancer Neg Hx     Social History   Socioeconomic History  . Marital status: Single    Spouse name: Not on file  . Number of children: Not on file  . Years of education: Not on file  . Highest education level: Not on file  Occupational History  . Not on file  Tobacco Use  . Smoking status: Never Smoker  . Smokeless tobacco: Never Used  Substance and Sexual Activity  . Alcohol use: No  . Drug use: Never  . Sexual activity: Not on file  Other Topics Concern  . Not on file  Social History Narrative  . Not on file   Social Determinants of Health   Financial Resource Strain:   . Difficulty of Paying Living Expenses: Not on file  Food Insecurity:   . Worried About Programme researcher, broadcasting/film/video in the Last Year: Not on file  . Ran Out of Food in the Last Year: Not on file  Transportation Needs:   . Lack of Transportation (Medical): Not on file  . Lack of Transportation (Non-Medical): Not on file  Physical Activity:   . Days of Exercise per Week: Not on file  . Minutes of Exercise per Session: Not on file  Stress:   .  Feeling of Stress : Not on file  Social Connections:   . Frequency of Communication with Friends and Family: Not on file  . Frequency of Social Gatherings with Friends and Family: Not on file  . Attends Religious Services: Not on file  . Active Member of Clubs or Organizations: Not on file  . Attends Banker Meetings: Not on file  . Marital Status: Not on file  Intimate Partner Violence:   . Fear of Current or Ex-Partner: Not on file  . Emotionally Abused: Not on file  . Physically Abused: Not on file  . Sexually Abused: Not on file     Current Outpatient Medications:  .  brompheniramine-pseudoephedrine-DM 30-2-10 MG/5ML syrup, Take 10 mLs by mouth 4 (four) times daily as needed., Disp: 120 mL, Rfl: 0 .  carbamazepine (TEGRETOL) 200 MG tablet, Take 200 mg by mouth 3 (three) times daily., Disp: , Rfl:  .  Cetirizine HCl 10 MG CAPS, Take 1 capsule (10 mg total) by mouth daily., Disp: 30 capsule, Rfl: 3 .  gabapentin (NEURONTIN) 800 MG tablet, Take 800 mg by mouth 3 (three) times daily., Disp: , Rfl:  .  oxyCODONE-acetaminophen (PERCOCET) 5-325 MG tablet, Take 1 tablet by  mouth every 4 (four) hours as needed for severe pain., Disp: 20 tablet, Rfl: 0 .  sulfamethoxazole-trimethoprim (BACTRIM DS,SEPTRA DS) 800-160 MG tablet, Take 1 tablet by mouth 2 (two) times daily., Disp: 20 tablet, Rfl: 0   Allergies  Allergen Reactions  . Codeine Nausea And Vomiting  . Penicillins Nausea And Vomiting    ROS Review of Systems  Constitutional: Negative.   HENT: Negative.  Negative for trouble swallowing.   Eyes: Negative.   Respiratory: Negative.  Negative for cough, chest tightness and shortness of breath.   Cardiovascular: Negative.  Negative for chest pain and palpitations.  Gastrointestinal: Negative.   Endocrine: Negative.   Genitourinary: Negative.   Musculoskeletal: Negative.   Skin: Negative.   Allergic/Immunologic: Negative.   Neurological: Positive for weakness  (cerebral palsy).  Hematological: Negative.   Psychiatric/Behavioral: Negative.   All other systems reviewed and are negative.    OBJECTIVE:    Physical Exam Vitals reviewed.  Constitutional:      Appearance: Normal appearance.  HENT:     Mouth/Throat:     Mouth: Mucous membranes are moist.  Eyes:     Pupils: Pupils are equal, round, and reactive to light.  Neck:     Vascular: No carotid bruit.  Cardiovascular:     Rate and Rhythm: Normal rate and regular rhythm.     Pulses: Normal pulses.     Heart sounds: Normal heart sounds.  Pulmonary:     Effort: Pulmonary effort is normal.     Breath sounds: Normal breath sounds.  Abdominal:     General: Bowel sounds are normal.     Palpations: Abdomen is soft. There is no hepatomegaly, splenomegaly or mass.     Tenderness: There is no abdominal tenderness.     Hernia: No hernia is present.  Musculoskeletal:        General: No tenderness.     Cervical back: Neck supple.     Right lower leg: No edema.     Left lower leg: 2+ Edema present.     Comments: Muscle loss on R side arm and leg due to cerebral palsy. Brace on R leg and ankle.   Skin:    Findings: No rash.  Neurological:     Mental Status: She is alert and oriented to person, place, and time.     Motor: No weakness.  Psychiatric:        Mood and Affect: Mood and affect normal.        Behavior: Behavior normal.     BP (!) 142/87   Pulse 69   Ht 5\' 3"  (1.6 m)   Wt 193 lb 14.4 oz (88 kg)   BMI 34.35 kg/m  Wt Readings from Last 3 Encounters:  07/05/20 193 lb 14.4 oz (88 kg)  01/27/19 175 lb (79.4 kg)  09/21/18 180 lb (81.6 kg)    Health Maintenance Due  Topic Date Due  . Hepatitis C Screening  Never done  . COVID-19 Vaccine (1) Never done  . HIV Screening  Never done  . TETANUS/TDAP  Never done  . PAP SMEAR-Modifier  Never done  . INFLUENZA VACCINE  06/03/2020    There are no preventive care reminders to display for this patient.  CBC Latest Ref Rng &  Units 10/22/2016  WBC 3.6 - 11.0 K/uL 3.6  Hemoglobin 12.0 - 16.0 g/dL 10/24/2016  Hematocrit 35 - 47 % 36.6  Platelets 150 - 440 K/uL 273   CMP Latest Ref Rng & Units  10/22/2016  Glucose 65 - 99 mg/dL 86  BUN 6 - 20 mg/dL 10  Creatinine 4.92 - 0.10 mg/dL 0.71  Sodium 219 - 758 mmol/L 130(L)  Potassium 3.5 - 5.1 mmol/L 4.0  Chloride 101 - 111 mmol/L 96(L)  CO2 22 - 32 mmol/L 26  Calcium 8.9 - 10.3 mg/dL 9.0    Lab Results  Component Value Date   TSH 2.234 10/22/2016   Lab Results  Component Value Date   ANIONGAP 8 10/22/2016   Lab Results  Component Value Date   CHOL 227 (H) 10/22/2016   HDL 90 10/22/2016   LDLCALC 130 (H) 10/22/2016   CHOLHDL 2.5 10/22/2016   Lab Results  Component Value Date   TRIG 34 10/22/2016   No results found for: HGBA1C    ASSESSMENT & PLAN:   Problem List Items Addressed This Visit      Other   Annual physical exam - Primary    Pt came for annual physical. Her main problem at the present time is cramps in the leg, mostly at night. She has 2+ swelling in the legs. She was advised to take tonic water as needed or mustard to see if that will relieve her symptoms. She also has cerebral palsy, which left her with atrophy of the right hand and weakness of the right leg muscle. She can walk and drive by herself without any assistance. My main advise on todays visit is thsat she should lose weight because she is more prone to having arthritis. Her blood will be drawn for CBC, CMP, Lipid panel.       Relevant Orders   CBC with Differential/Platelet   COMPLETE METABOLIC PANEL WITH GFR   TSH   Lipid panel   Edema    We will give lasix 20mg  twice per week. She does not have any evidence of DVT. Pedal pulses are normal.       Leg cramp    Pt was advised to take tonic water or mustard to soothe leg cramps.       Metabolic syndrome    - I encouraged the patient to lose weight.  - I educated them on making healthy dietary choices including eating  more fruits and vegetables and less fried foods. - I encouraged the patient to exercise more, and educated on the benefits of exercise including weight loss, diabetes prevention, and hypertension prevention.           No orders of the defined types were placed in this encounter.   Follow-up: No follow-ups on file.    Dr. [facility] [address] [place], [place]   By signing my name below, I, [id number], attest that this documentation has been prepared under the direction and in the presence of [employer], MD. Electronically Signed: Corky Downs, MD 07/05/20, 10:54 AM   I personally performed the services described in this documentation, which was SCRIBED in my presence. The recorded information has been reviewed and considered accurate. It has been edited as necessary during review. 09/04/20, MD

## 2020-07-05 NOTE — Assessment & Plan Note (Signed)
Pt was advised to take tonic water or mustard to soothe leg cramps.

## 2020-07-05 NOTE — Patient Instructions (Signed)

## 2020-07-05 NOTE — Assessment & Plan Note (Signed)
-   I encouraged the patient to lose weight.  - I educated them on making healthy dietary choices including eating more fruits and vegetables and less fried foods. - I encouraged the patient to exercise more, and educated on the benefits of exercise including weight loss, diabetes prevention, and hypertension prevention.   

## 2020-07-05 NOTE — Assessment & Plan Note (Signed)
Pt came for annual physical. Her main problem at the present time is cramps in the leg, mostly at night. She has 2+ swelling in the legs. She was advised to take tonic water as needed or mustard to see if that will relieve her symptoms. She also has cerebral palsy, which left her with atrophy of the right hand and weakness of the right leg muscle. She can walk and drive by herself without any assistance. My main advise on todays visit is thsat she should lose weight because she is more prone to having arthritis. Her blood will be drawn for CBC, CMP, Lipid panel.

## 2020-07-05 NOTE — Assessment & Plan Note (Signed)
We will give lasix 20mg  twice per week. She does not have any evidence of DVT. Pedal pulses are normal.

## 2020-07-06 LAB — CBC WITH DIFFERENTIAL/PLATELET
Absolute Monocytes: 408 cells/uL (ref 200–950)
Basophils Absolute: 40 cells/uL (ref 0–200)
Basophils Relative: 1 %
Eosinophils Absolute: 20 cells/uL (ref 15–500)
Eosinophils Relative: 0.5 %
HCT: 37.1 % (ref 35.0–45.0)
Hemoglobin: 12.4 g/dL (ref 11.7–15.5)
Lymphs Abs: 1004 cells/uL (ref 850–3900)
MCH: 29 pg (ref 27.0–33.0)
MCHC: 33.4 g/dL (ref 32.0–36.0)
MCV: 86.7 fL (ref 80.0–100.0)
MPV: 9.6 fL (ref 7.5–12.5)
Monocytes Relative: 10.2 %
Neutro Abs: 2528 cells/uL (ref 1500–7800)
Neutrophils Relative %: 63.2 %
Platelets: 259 10*3/uL (ref 140–400)
RBC: 4.28 10*6/uL (ref 3.80–5.10)
RDW: 12.8 % (ref 11.0–15.0)
Total Lymphocyte: 25.1 %
WBC: 4 10*3/uL (ref 3.8–10.8)

## 2020-07-06 LAB — LIPID PANEL
Cholesterol: 247 mg/dL — ABNORMAL HIGH (ref <200)
HDL: 87 mg/dL (ref >=50)
LDL Cholesterol (Calc): 144 mg/dL (calc) — ABNORMAL HIGH
Non-HDL Cholesterol (Calc): 160 mg/dL (calc) — ABNORMAL HIGH (ref <130)
Total CHOL/HDL Ratio: 2.8 (calc) (ref <5.0)
Triglycerides: 61 mg/dL (ref <150)

## 2020-07-06 LAB — TSH: TSH: 2.6 mIU/L (ref 0.40–4.50)

## 2020-07-06 LAB — COMPLETE METABOLIC PANEL WITH GFR
AG Ratio: 1.4 (calc) (ref 1.0–2.5)
ALT: 11 U/L (ref 6–29)
AST: 22 U/L (ref 10–35)
Albumin: 4.2 g/dL (ref 3.6–5.1)
Alkaline phosphatase (APISO): 111 U/L (ref 37–153)
BUN: 8 mg/dL (ref 7–25)
CO2: 20 mmol/L (ref 20–32)
Calcium: 9 mg/dL (ref 8.6–10.4)
Chloride: 88 mmol/L — ABNORMAL LOW (ref 98–110)
Creat: 0.68 mg/dL (ref 0.50–1.05)
GFR, Est African American: 112 mL/min/{1.73_m2} (ref >=60)
GFR, Est Non African American: 96 mL/min/{1.73_m2} (ref >=60)
Globulin: 2.9 g/dL (calc) (ref 1.9–3.7)
Glucose, Bld: 86 mg/dL (ref 65–99)
Potassium: 4.7 mmol/L (ref 3.5–5.3)
Sodium: 122 mmol/L — ABNORMAL LOW (ref 135–146)
Total Bilirubin: 0.3 mg/dL (ref 0.2–1.2)
Total Protein: 7.1 g/dL (ref 6.1–8.1)

## 2020-08-31 ENCOUNTER — Ambulatory Visit (INDEPENDENT_AMBULATORY_CARE_PROVIDER_SITE_OTHER): Payer: Medicare Other | Admitting: Family Medicine

## 2020-08-31 ENCOUNTER — Encounter: Payer: Self-pay | Admitting: Family Medicine

## 2020-08-31 ENCOUNTER — Other Ambulatory Visit: Payer: Self-pay

## 2020-08-31 VITALS — BP 144/88 | HR 66 | Ht 65.0 in | Wt 201.0 lb

## 2020-08-31 DIAGNOSIS — R609 Edema, unspecified: Secondary | ICD-10-CM

## 2020-08-31 MED ORDER — FUROSEMIDE 20 MG PO TABS: 20.0000 mg | ORAL_TABLET | Freq: Every day | ORAL | Status: DC | PRN

## 2020-09-07 ENCOUNTER — Ambulatory Visit (INDEPENDENT_AMBULATORY_CARE_PROVIDER_SITE_OTHER): Payer: Medicare Other | Admitting: Family Medicine

## 2020-09-07 ENCOUNTER — Other Ambulatory Visit: Payer: Self-pay

## 2020-09-07 ENCOUNTER — Encounter: Payer: Self-pay | Admitting: Family Medicine

## 2020-09-07 VITALS — BP 124/71 | HR 79 | Ht 64.0 in | Wt 196.4 lb

## 2020-09-07 DIAGNOSIS — R609 Edema, unspecified: Secondary | ICD-10-CM

## 2020-09-07 NOTE — Progress Notes (Signed)
Established Patient Office Visit  SUBJECTIVE:  Subjective  Patient ID: Brittany Miles, female    DOB: [birth year]  Age: 59 y.o. MRN: [id number]  CC:  Chief Complaint  Patient presents with   Foot Swelling    Patient complains of foot swelling for the past 2 weeks. Patient states she has been trying to saok her feet and keep them elevated with no improvement.    HPI Brittany Miles is a 59 y.o. female presenting today for feet swelling   Past Medical History:  Diagnosis Date   FH: cerebral palsy    Seizures (HCC)     Past Surgical History:  Procedure Laterality Date   COLONOSCOPY WITH PROPOFOL N/A 09/21/2018   Procedure: COLONOSCOPY WITH PROPOFOL;  Surgeon: Toney Reil, MD;  Location: [facility] ENDOSCOPY;  Service: Gastroenterology;  Laterality: N/A;   dog bite      Family History  Problem Relation Age of Onset   Breast cancer Neg Hx     Social History   Socioeconomic History   Marital status: Single    Spouse name: Not on file   Number of children: Not on file   Years of education: Not on file   Highest education level: Not on file  Occupational History   Not on file  Tobacco Use   Smoking status: Never Smoker   Smokeless tobacco: Never Used  Substance and Sexual Activity   Alcohol use: No   Drug use: Never   Sexual activity: Not on file  Other Topics Concern   Not on file  Social History Narrative   Not on file   Social Determinants of Health   Financial Resource Strain:    Difficulty of Paying Living Expenses: Not on file  Food Insecurity:    Worried About Running Out of Food in the Last Year: Not on file   Ran Out of Food in the Last Year: Not on file  Transportation Needs:    Lack of Transportation (Medical): Not on file   Lack of Transportation (Non-Medical): Not on file  Physical Activity:    Days of Exercise per Week: Not on file   Minutes of Exercise per Session: Not on file  Stress:    Feeling of Stress : Not  on file  Social Connections:    Frequency of Communication with Friends and Family: Not on file   Frequency of Social Gatherings with Friends and Family: Not on file   Attends Religious Services: Not on file   Active Member of Clubs or Organizations: Not on file   Attends Banker Meetings: Not on file   Marital Status: Not on file  Intimate Partner Violence:    Fear of Current or Ex-Partner: Not on file   Emotionally Abused: Not on file   Physically Abused: Not on file   Sexually Abused: Not on file     Current Outpatient Medications:    carbamazepine (TEGRETOL) 200 MG tablet, Take 200 mg by mouth 3 (three) times daily., Disp: , Rfl:    gabapentin (NEURONTIN) 800 MG tablet, Take 800 mg by mouth 3 (three) times daily., Disp: , Rfl:   Current Facility-Administered Medications:    furosemide (LASIX) tablet 20 mg, 20 mg, Oral, Daily PRN, Irish Lack, FNP   Allergies  Allergen Reactions   Codeine Nausea And Vomiting   Penicillins Nausea And Vomiting    ROS Review of Systems  Constitutional: Negative.   HENT: Negative.   Respiratory: Negative.   Cardiovascular: Positive for leg  swelling.  Hematological: Negative.      OBJECTIVE:    Physical Exam Vitals and nursing note reviewed.  HENT:     Nose: Nose normal.     Mouth/Throat:     Mouth: Mucous membranes are moist.  Eyes:     Pupils: Pupils are equal, round, and reactive to light.  Cardiovascular:     Pulses: Normal pulses.  Pulmonary:     Effort: Pulmonary effort is normal.  Musculoskeletal:     Cervical back: Normal range of motion.     Right lower leg: Edema present.     Left lower leg: Edema present.  Skin:    General: Skin is warm.  Neurological:     Mental Status: She is alert.     BP (!) 144/88    Pulse 66    Ht 5\' 5"  (1.651 m)    Wt 201 lb (91.2 kg) Comment: Patient requested to not be weighed today due to swelling   BMI 33.45 kg/m  Wt Readings from Last 3 Encounters:    08/31/20 201 lb (91.2 kg)  07/05/20 193 lb 14.4 oz (88 kg)  01/27/19 175 lb (79.4 kg)    Health Maintenance Due  Topic Date Due   Hepatitis C Screening  Never done   COVID-19 Vaccine (1) Never done   HIV Screening  Never done   TETANUS/TDAP  Never done   PAP SMEAR-Modifier  Never done   INFLUENZA VACCINE  06/03/2020    There are no preventive care reminders to display for this patient.  CBC Latest Ref Rng & Units 07/05/2020 10/22/2016  WBC 3.8 - 10.8 Thousand/uL 4.0 3.6  Hemoglobin 11.7 - 15.5 g/dL 10/24/2016 35.4  Hematocrit 35 - 45 % 37.1 36.6  Platelets 140 - 400 Thousand/uL 259 273   CMP Latest Ref Rng & Units 07/05/2020 10/22/2016  Glucose 65 - 99 mg/dL 86 86  BUN 7 - 25 mg/dL 8 10  Creatinine 10/24/2016 - 1.05 mg/dL 5.63 8.93  Sodium 7.34 - 146 mmol/L 122(L) 130(L)  Potassium 3.5 - 5.3 mmol/L 4.7 4.0  Chloride 98 - 110 mmol/L 88(L) 96(L)  CO2 20 - 32 mmol/L 20 26  Calcium 8.6 - 10.4 mg/dL 9.0 9.0  Total Protein 6.1 - 8.1 g/dL 7.1 -  Total Bilirubin 0.2 - 1.2 mg/dL 0.3 -  AST 10 - 35 U/L 22 -  ALT 6 - 29 U/L 11 -    Lab Results  Component Value Date   TSH 2.60 07/05/2020   Lab Results  Component Value Date   ANIONGAP 8 10/22/2016   Lab Results  Component Value Date   CHOL 247 (H) 07/05/2020   CHOL 227 (H) 10/22/2016   HDL 87 07/05/2020   HDL 90 10/22/2016   LDLCALC 144 (H) 07/05/2020   LDLCALC 130 (H) 10/22/2016   CHOLHDL 2.8 07/05/2020   CHOLHDL 2.5 10/22/2016   Lab Results  Component Value Date   TRIG 61 07/05/2020   No results found for: HGBA1C    ASSESSMENT & PLAN:   Problem List Items Addressed This Visit      Other   Edema - Primary    Swelling of feet bilat, no sob or CP, BP has been wnl       Relevant Medications   furosemide (LASIX) tablet 20 mg      Meds ordered this encounter  Medications   furosemide (LASIX) tablet 20 mg      Follow-up: No follow-ups on file.  Irish Lack, FNP [facility] [address] [place], [place] [address]

## 2020-09-07 NOTE — Progress Notes (Signed)
Established Patient Office Visit  SUBJECTIVE:  Subjective  Patient ID: Brittany Miles, female    DOB: July 07, 1961  Age: 59 y.o. MRN: [id number]  CC:  Chief Complaint  Patient presents with  . Edema    1 week follow up\ edema in feet     HPI Brittany Miles is a 59 y.o. female presenting today for fu leg swelling  Past Medical History:  Diagnosis Date  . FH: cerebral palsy   . Seizures (HCC)     Past Surgical History:  Procedure Laterality Date  . COLONOSCOPY WITH PROPOFOL N/A 09/21/2018   Procedure: COLONOSCOPY WITH PROPOFOL;  Surgeon: Toney Reil, MD;  Location: [facility] ENDOSCOPY;  Service: Gastroenterology;  Laterality: N/A;  . dog bite      Family History  Problem Relation Age of Onset  . Breast cancer Neg Hx     Social History   Socioeconomic History  . Marital status: Single    Spouse name: Not on file  . Number of children: Not on file  . Years of education: Not on file  . Highest education level: Not on file  Occupational History  . Not on file  Tobacco Use  . Smoking status: Never Smoker  . Smokeless tobacco: Never Used  Substance and Sexual Activity  . Alcohol use: No  . Drug use: Never  . Sexual activity: Not on file  Other Topics Concern  . Not on file  Social History Narrative  . Not on file   Social Determinants of Health   Financial Resource Strain:   . Difficulty of Paying Living Expenses: Not on file  Food Insecurity:   . Worried About Programme researcher, broadcasting/film/video in the Last Year: Not on file  . Ran Out of Food in the Last Year: Not on file  Transportation Needs:   . Lack of Transportation (Medical): Not on file  . Lack of Transportation (Non-Medical): Not on file  Physical Activity:   . Days of Exercise per Week: Not on file  . Minutes of Exercise per Session: Not on file  Stress:   . Feeling of Stress : Not on file  Social Connections:   . Frequency of Communication with Friends and Family: Not on file  . Frequency of Social  Gatherings with Friends and Family: Not on file  . Attends Religious Services: Not on file  . Active Member of Clubs or Organizations: Not on file  . Attends Banker Meetings: Not on file  . Marital Status: Not on file  Intimate Partner Violence:   . Fear of Current or Ex-Partner: Not on file  . Emotionally Abused: Not on file  . Physically Abused: Not on file  . Sexually Abused: Not on file     Current Outpatient Medications:  .  carbamazepine (TEGRETOL) 200 MG tablet, Take 200 mg by mouth 3 (three) times daily., Disp: , Rfl:  .  gabapentin (NEURONTIN) 800 MG tablet, Take 800 mg by mouth 3 (three) times daily., Disp: , Rfl:   Current Facility-Administered Medications:  .  furosemide (LASIX) tablet 20 mg, 20 mg, Oral, Daily PRN, Irish Lack, FNP   Allergies  Allergen Reactions  . Codeine Nausea And Vomiting  . Penicillins Nausea And Vomiting    ROS Review of Systems  Constitutional: Negative.   HENT: Negative.   Respiratory: Negative.   Cardiovascular: Negative.   Genitourinary: Negative.   Musculoskeletal: Negative.   Neurological: Negative.      OBJECTIVE:    Physical  Exam Vitals and nursing note reviewed.  Constitutional:      Appearance: Normal appearance.  HENT:     Head: Normocephalic.     Mouth/Throat:     Mouth: Mucous membranes are moist.  Eyes:     Pupils: Pupils are equal, round, and reactive to light.  Cardiovascular:     Rate and Rhythm: Normal rate and regular rhythm.  Pulmonary:     Effort: Pulmonary effort is normal.  Abdominal:     General: Abdomen is flat.  Musculoskeletal:        General: Swelling present.     Cervical back: Normal range of motion.  Skin:    General: Skin is warm.     Capillary Refill: Capillary refill takes less than 2 seconds.     Comments: Mild swelling bilat  Neurological:     Mental Status: She is alert.  Psychiatric:        Mood and Affect: Mood normal.     BP 124/71   Pulse 79   Ht 5\' 4"   (1.626 m)   Wt 196 lb 6.4 oz (89.1 kg)   BMI 33.71 kg/m  Wt Readings from Last 3 Encounters:  09/07/20 196 lb 6.4 oz (89.1 kg)  08/31/20 201 lb (91.2 kg)  07/05/20 193 lb 14.4 oz (88 kg)    Health Maintenance Due  Topic Date Due  . Hepatitis C Screening  Never done  . COVID-19 Vaccine (1) Never done  . HIV Screening  Never done  . TETANUS/TDAP  Never done  . PAP SMEAR-Modifier  Never done  . INFLUENZA VACCINE  06/03/2020    There are no preventive care reminders to display for this patient.  CBC Latest Ref Rng & Units 07/05/2020 10/22/2016  WBC 3.8 - 10.8 Thousand/uL 4.0 3.6  Hemoglobin 11.7 - 15.5 g/dL 10/24/2016 44.8  Hematocrit 35 - 45 % 37.1 36.6  Platelets 140 - 400 Thousand/uL 259 273   CMP Latest Ref Rng & Units 07/05/2020 10/22/2016  Glucose 65 - 99 mg/dL 86 86  BUN 7 - 25 mg/dL 8 10  Creatinine 10/24/2016 - 1.05 mg/dL 6.31 4.97  Sodium 0.26 - 146 mmol/L 122(L) 130(L)  Potassium 3.5 - 5.3 mmol/L 4.7 4.0  Chloride 98 - 110 mmol/L 88(L) 96(L)  CO2 20 - 32 mmol/L 20 26  Calcium 8.6 - 10.4 mg/dL 9.0 9.0  Total Protein 6.1 - 8.1 g/dL 7.1 -  Total Bilirubin 0.2 - 1.2 mg/dL 0.3 -  AST 10 - 35 U/L 22 -  ALT 6 - 29 U/L 11 -    Lab Results  Component Value Date   TSH 2.60 07/05/2020   Lab Results  Component Value Date   ANIONGAP 8 10/22/2016   Lab Results  Component Value Date   CHOL 247 (H) 07/05/2020   CHOL 227 (H) 10/22/2016   HDL 87 07/05/2020   HDL 90 10/22/2016   LDLCALC 144 (H) 07/05/2020   LDLCALC 130 (H) 10/22/2016   CHOLHDL 2.8 07/05/2020   CHOLHDL 2.5 10/22/2016   Lab Results  Component Value Date   TRIG 61 07/05/2020   No results found for: HGBA1C    ASSESSMENT & PLAN:   Problem List Items Addressed This Visit      Other   Edema - Primary    Pt with 5 lb weight loss since starting Lasix last week for lower leg Edema. No leg cramping, legs feel much less swollen she reports. Plan continue Lasix prn and fu in 1  month.          No orders of the  defined types were placed in this encounter.     Follow-up: No follow-ups on file.    Irish Lack, FNP [facility] [address] [place], [place] [address]

## 2020-09-07 NOTE — Assessment & Plan Note (Signed)
Pt with 5 lb weight loss since starting Lasix last week for lower leg Edema. No leg cramping, legs feel much less swollen she reports. Plan continue Lasix prn and fu in 1 month.

## 2020-09-07 NOTE — Assessment & Plan Note (Addendum)
Swelling of feet bilat, no sob or CP, BP has been wnl. Start Lasix 20 mg Q D x 1 week then prn for swelling, fu 1 week.

## 2020-10-05 ENCOUNTER — Encounter: Payer: Self-pay | Admitting: Internal Medicine

## 2020-10-05 ENCOUNTER — Ambulatory Visit (INDEPENDENT_AMBULATORY_CARE_PROVIDER_SITE_OTHER): Payer: Medicare Other | Admitting: Internal Medicine

## 2020-10-05 ENCOUNTER — Other Ambulatory Visit: Payer: Self-pay

## 2020-10-05 VITALS — BP 111/78 | HR 73 | Ht 63.0 in | Wt 193.8 lb

## 2020-10-05 DIAGNOSIS — E8881 Metabolic syndrome: Secondary | ICD-10-CM

## 2020-10-05 DIAGNOSIS — M1712 Unilateral primary osteoarthritis, left knee: Secondary | ICD-10-CM | POA: Insufficient documentation

## 2020-10-05 DIAGNOSIS — R569 Unspecified convulsions: Secondary | ICD-10-CM | POA: Diagnosis not present

## 2020-10-05 DIAGNOSIS — E871 Hypo-osmolality and hyponatremia: Secondary | ICD-10-CM | POA: Insufficient documentation

## 2020-10-05 NOTE — Progress Notes (Signed)
Established Patient Office Visit  Subjective:  Patient ID: Brittany Miles, female    DOB: [birth year]  Age: 59 y.o. MRN: [id number]  CC:  Chief Complaint  Patient presents with  . Follow-up    no complaints     HPI  Brittany Miles presents for lt knee pain  Past Medical History:  Diagnosis Date  . FH: cerebral palsy   . Seizures (HCC)     Past Surgical History:  Procedure Laterality Date  . COLONOSCOPY WITH PROPOFOL N/A 09/21/2018   Procedure: COLONOSCOPY WITH PROPOFOL;  Surgeon: Toney Reil, MD;  Location: [facility] ENDOSCOPY;  Service: Gastroenterology;  Laterality: N/A;  . dog bite      Family History  Problem Relation Age of Onset  . Breast cancer Neg Hx     Social History   Socioeconomic History  . Marital status: Single    Spouse name: Not on file  . Number of children: Not on file  . Years of education: Not on file  . Highest education level: Not on file  Occupational History  . Not on file  Tobacco Use  . Smoking status: Never Smoker  . Smokeless tobacco: Never Used  Substance and Sexual Activity  . Alcohol use: No  . Drug use: Never  . Sexual activity: Not on file  Other Topics Concern  . Not on file  Social History Narrative  . Not on file   Social Determinants of Health   Financial Resource Strain:   . Difficulty of Paying Living Expenses: Not on file  Food Insecurity:   . Worried About Programme researcher, broadcasting/film/video in the Last Year: Not on file  . Ran Out of Food in the Last Year: Not on file  Transportation Needs:   . Lack of Transportation (Medical): Not on file  . Lack of Transportation (Non-Medical): Not on file  Physical Activity:   . Days of Exercise per Week: Not on file  . Minutes of Exercise per Session: Not on file  Stress:   . Feeling of Stress : Not on file  Social Connections:   . Frequency of Communication with Friends and Family: Not on file  . Frequency of Social Gatherings with Friends and Family: Not on file  . Attends  Religious Services: Not on file  . Active Member of Clubs or Organizations: Not on file  . Attends Banker Meetings: Not on file  . Marital Status: Not on file  Intimate Partner Violence:   . Fear of Current or Ex-Partner: Not on file  . Emotionally Abused: Not on file  . Physically Abused: Not on file  . Sexually Abused: Not on file     Current Outpatient Medications:  .  carbamazepine (TEGRETOL) 200 MG tablet, Take 200 mg by mouth 3 (three) times daily., Disp: , Rfl:  .  gabapentin (NEURONTIN) 800 MG tablet, Take 800 mg by mouth 3 (three) times daily., Disp: , Rfl:   Current Facility-Administered Medications:  .  furosemide (LASIX) tablet 20 mg, 20 mg, Oral, Daily PRN, Irish Lack, FNP   Allergies  Allergen Reactions  . Codeine Nausea And Vomiting  . Penicillins Nausea And Vomiting    ROS Review of Systems  Constitutional: Negative.   HENT: Negative.   Eyes: Negative.   Respiratory: Negative.   Cardiovascular: Negative.   Gastrointestinal: Negative.   Endocrine: Negative.   Genitourinary: Negative.   Musculoskeletal: Positive for joint swelling.  Skin: Negative.   Allergic/Immunologic: Negative.   Neurological: Negative.  Negative for headaches.  Hematological: Negative.   Psychiatric/Behavioral: Negative.   All other systems reviewed and are negative.     Objective:    Physical Exam Vitals reviewed.  Constitutional:      Appearance: Normal appearance.  HENT:     Mouth/Throat:     Mouth: Mucous membranes are moist.  Eyes:     Pupils: Pupils are equal, round, and reactive to light.  Neck:     Vascular: No carotid bruit.  Cardiovascular:     Rate and Rhythm: Normal rate and regular rhythm.     Pulses: Normal pulses.     Heart sounds: Normal heart sounds.  Pulmonary:     Effort: Pulmonary effort is normal.     Breath sounds: Normal breath sounds.  Abdominal:     General: Bowel sounds are normal.     Palpations: Abdomen is soft. There  is no hepatomegaly, splenomegaly or mass.     Tenderness: There is no abdominal tenderness.     Hernia: No hernia is present.  Musculoskeletal:        General: No tenderness.     Cervical back: Neck supple.     Right lower leg: No edema.     Left lower leg: No edema.     Comments: Left knee is swollen and tender.  Skin:    Findings: No rash.  Neurological:     Mental Status: She is alert and oriented to person, place, and time.     Motor: No weakness.  Psychiatric:        Mood and Affect: Mood and affect normal.        Behavior: Behavior normal.     BP 111/78   Pulse 73   Ht 5\' 3"  (1.6 m)   Wt 193 lb 12.8 oz (87.9 kg)   BMI 34.33 kg/m  Wt Readings from Last 3 Encounters:  10/05/20 193 lb 12.8 oz (87.9 kg)  09/07/20 196 lb 6.4 oz (89.1 kg)  08/31/20 201 lb (91.2 kg)     Health Maintenance Due  Topic Date Due  . Hepatitis C Screening  Never done  . COVID-19 Vaccine (1) Never done  . HIV Screening  Never done  . TETANUS/TDAP  Never done  . PAP SMEAR-Modifier  Never done  . INFLUENZA VACCINE  06/03/2020    There are no preventive care reminders to display for this patient.  Lab Results  Component Value Date   TSH 2.60 07/05/2020   Lab Results  Component Value Date   WBC 4.0 07/05/2020   HGB 12.4 07/05/2020   HCT 37.1 07/05/2020   MCV 86.7 07/05/2020   PLT 259 07/05/2020   Lab Results  Component Value Date   NA 122 (L) 07/05/2020   K 4.7 07/05/2020   CO2 20 07/05/2020   GLUCOSE 86 07/05/2020   BUN 8 07/05/2020   CREATININE 0.68 07/05/2020   BILITOT 0.3 07/05/2020   AST 22 07/05/2020   ALT 11 07/05/2020   PROT 7.1 07/05/2020   CALCIUM 9.0 07/05/2020   ANIONGAP 8 10/22/2016   Lab Results  Component Value Date   CHOL 247 (H) 07/05/2020   Lab Results  Component Value Date   HDL 87 07/05/2020   Lab Results  Component Value Date   LDLCALC 144 (H) 07/05/2020   Lab Results  Component Value Date   TRIG 61 07/05/2020   Lab Results  Component  Value Date   CHOLHDL 2.8 07/05/2020   No results found for:  HGBA1C    Assessment & Plan:   Problem List Items Addressed This Visit      Musculoskeletal and Integument   Primary osteoarthritis of left knee - Primary     Other   Metabolic syndrome    - I encouraged the patient to lose weight.  - I educated them on making healthy dietary choices including eating more fruits and vegetables and less fried foods. - I encouraged the patient to exercise more, and educated on the benefits of exercise including weight loss, diabetes prevention, and hypertension prevention.        Seizures (HCC)    Seizure is stable is stable at the present time      Hyponatremia    We will repeat electrolyte.  Hyponatremia can be due to Tegretol or gabapentin.         No orders of the defined types were placed in this encounter.   Follow-up: No follow-ups on file.    Corky Downs, MD

## 2020-10-05 NOTE — Assessment & Plan Note (Signed)
We will repeat electrolyte.  Hyponatremia can be due to Tegretol or gabapentin.

## 2020-10-05 NOTE — Addendum Note (Signed)
Addended by: Melody Comas L on: 10/05/2020 11:18 AM   Modules accepted: Orders

## 2020-10-05 NOTE — Assessment & Plan Note (Signed)
-   I encouraged the patient to lose weight.  - I educated them on making healthy dietary choices including eating more fruits and vegetables and less fried foods. - I encouraged the patient to exercise more, and educated on the benefits of exercise including weight loss, diabetes prevention, and hypertension prevention.   

## 2020-10-05 NOTE — Assessment & Plan Note (Signed)
Seizure is stable is stable at the present time

## 2020-10-06 LAB — ELECTROLYTE PANEL
CO2: 27 mmol/L (ref 20–32)
Chloride: 97 mmol/L — ABNORMAL LOW (ref 98–110)
Potassium: 4.2 mmol/L (ref 3.5–5.3)
Sodium: 138 mmol/L (ref 135–146)

## 2020-10-11 ENCOUNTER — Encounter: Payer: Self-pay | Admitting: Family Medicine

## 2020-10-11 ENCOUNTER — Other Ambulatory Visit: Payer: Self-pay

## 2020-10-11 ENCOUNTER — Ambulatory Visit (INDEPENDENT_AMBULATORY_CARE_PROVIDER_SITE_OTHER): Payer: Medicare Other | Admitting: Family Medicine

## 2020-10-11 VITALS — BP 106/74 | HR 79 | Ht 64.0 in | Wt 192.7 lb

## 2020-10-11 DIAGNOSIS — E782 Mixed hyperlipidemia: Secondary | ICD-10-CM | POA: Diagnosis not present

## 2020-10-11 MED ORDER — ATORVASTATIN CALCIUM 20 MG PO TABS: 20.0000 mg | ORAL_TABLET | Freq: Every day | ORAL | 3 refills | Status: DC

## 2020-10-11 NOTE — Assessment & Plan Note (Signed)
Discussed labs with patient, she has started her Atorvastatin back and will fu in 6 months.

## 2020-10-11 NOTE — Progress Notes (Signed)
Established Patient Office Visit  SUBJECTIVE:  Subjective  Patient ID: Brittany Miles, female    DOB: 07/16/1961  Age: 59 y.o. MRN: [id number]  CC:  Chief Complaint  Patient presents with  . Lab Results    HPI Brittany Miles is a 59 y.o. female presenting today for lab review and Lipid management.     Past Medical History:  Diagnosis Date  . FH: cerebral palsy   . Seizures (HCC)     Past Surgical History:  Procedure Laterality Date  . COLONOSCOPY WITH PROPOFOL N/A 09/21/2018   Procedure: COLONOSCOPY WITH PROPOFOL;  Surgeon: Toney Reil, MD;  Location: [facility] ENDOSCOPY;  Service: Gastroenterology;  Laterality: N/A;  . dog bite      Family History  Problem Relation Age of Onset  . Breast cancer Neg Hx     Social History   Socioeconomic History  . Marital status: Single    Spouse name: Not on file  . Number of children: Not on file  . Years of education: Not on file  . Highest education level: Not on file  Occupational History  . Not on file  Tobacco Use  . Smoking status: Never Smoker  . Smokeless tobacco: Never Used  Substance and Sexual Activity  . Alcohol use: No  . Drug use: Never  . Sexual activity: Not on file  Other Topics Concern  . Not on file  Social History Narrative  . Not on file   Social Determinants of Health   Financial Resource Strain: Not on file  Food Insecurity: Not on file  Transportation Needs: Not on file  Physical Activity: Not on file  Stress: Not on file  Social Connections: Not on file  Intimate Partner Violence: Not on file     Current Outpatient Medications:  .  atorvastatin (LIPITOR) 20 MG tablet, Take 1 tablet (20 mg total) by mouth daily., Disp: 90 tablet, Rfl: 3 .  carbamazepine (TEGRETOL) 200 MG tablet, Take 200 mg by mouth 3 (three) times daily., Disp: , Rfl:  .  gabapentin (NEURONTIN) 800 MG tablet, Take 800 mg by mouth 3 (three) times daily., Disp: , Rfl:   Current Facility-Administered Medications:   .  furosemide (LASIX) tablet 20 mg, 20 mg, Oral, Daily PRN, Irish Lack, FNP   Allergies  Allergen Reactions  . Codeine Nausea And Vomiting  . Penicillins Nausea And Vomiting    ROS Review of Systems  Constitutional: Negative.   HENT: Negative.   Respiratory: Negative.   Cardiovascular: Negative.   Genitourinary: Negative.   Musculoskeletal: Negative.   Psychiatric/Behavioral: Negative.      OBJECTIVE:    Physical Exam HENT:     Head: Normocephalic.     Mouth/Throat:     Mouth: Mucous membranes are moist.  Eyes:     Pupils: Pupils are equal, round, and reactive to light.  Cardiovascular:     Rate and Rhythm: Normal rate and regular rhythm.     Pulses: Normal pulses.  Pulmonary:     Effort: Pulmonary effort is normal.  Musculoskeletal:        General: Normal range of motion.  Skin:    General: Skin is warm.  Neurological:     General: No focal deficit present.     Mental Status: She is alert.     BP 106/74   Pulse 79   Ht 5\' 4"  (1.626 m)   Wt 192 lb 11.2 oz (87.4 kg)   BMI 33.08 kg/m  Wt Readings from  Last 3 Encounters:  10/11/20 192 lb 11.2 oz (87.4 kg)  10/05/20 193 lb 12.8 oz (87.9 kg)  09/07/20 196 lb 6.4 oz (89.1 kg)    Health Maintenance Due  Topic Date Due  . Hepatitis C Screening  Never done  . COVID-19 Vaccine (1) Never done  . HIV Screening  Never done  . TETANUS/TDAP  Never done  . PAP SMEAR-Modifier  Never done  . INFLUENZA VACCINE  06/03/2020  . MAMMOGRAM  10/07/2020    There are no preventive care reminders to display for this patient.  CBC Latest Ref Rng & Units 07/05/2020 10/22/2016  WBC 3.8 - 10.8 Thousand/uL 4.0 3.6  Hemoglobin 11.7 - 15.5 g/dL 38.2 50.5  Hematocrit 39.7 - 45.0 % 37.1 36.6  Platelets 140 - 400 Thousand/uL 259 273   CMP Latest Ref Rng & Units 10/05/2020 07/05/2020 10/22/2016  Glucose 65 - 99 mg/dL - 86 86  BUN 7 - 25 mg/dL - 8 10  Creatinine 6.73 - 1.05 mg/dL - 4.19 3.79  Sodium 024 - 146 mmol/L 138 122(L)  130(L)  Potassium 3.5 - 5.3 mmol/L 4.2 4.7 4.0  Chloride 98 - 110 mmol/L 97(L) 88(L) 96(L)  CO2 20 - 32 mmol/L 27 20 26   Calcium 8.6 - 10.4 mg/dL - 9.0 9.0  Total Protein 6.1 - 8.1 g/dL - 7.1 -  Total Bilirubin 0.2 - 1.2 mg/dL - 0.3 -  AST 10 - 35 U/L - 22 -  ALT 6 - 29 U/L - 11 -    Lab Results  Component Value Date   TSH 2.60 07/05/2020   Lab Results  Component Value Date   ANIONGAP 8 10/22/2016   Lab Results  Component Value Date   CHOL 247 (H) 07/05/2020   CHOL 227 (H) 10/22/2016   HDL 87 07/05/2020   HDL 90 10/22/2016   LDLCALC 144 (H) 07/05/2020   LDLCALC 130 (H) 10/22/2016   CHOLHDL 2.8 07/05/2020   CHOLHDL 2.5 10/22/2016   Lab Results  Component Value Date   TRIG 61 07/05/2020   No results found for: HGBA1C    ASSESSMENT & PLAN:   Problem List Items Addressed This Visit      Other   Mixed hyperlipidemia - Primary    Discussed labs with patient, she has started her Atorvastatin back and will fu in 6 months.       Relevant Medications   atorvastatin (LIPITOR) 20 MG tablet      Meds ordered this encounter  Medications  . atorvastatin (LIPITOR) 20 MG tablet    Sig: Take 1 tablet (20 mg total) by mouth daily.    Dispense:  90 tablet    Refill:  3      Follow-up: No follow-ups on file.    09/04/2020, FNP [facility] [address], [place], [place]

## 2021-01-01 DIAGNOSIS — Z79899 Other long term (current) drug therapy: Secondary | ICD-10-CM | POA: Diagnosis not present

## 2021-03-15 ENCOUNTER — Ambulatory Visit: Payer: Medicare Other | Admitting: Family Medicine

## 2021-03-22 ENCOUNTER — Other Ambulatory Visit: Payer: Self-pay

## 2021-03-22 ENCOUNTER — Other Ambulatory Visit: Payer: Self-pay | Admitting: Internal Medicine

## 2021-03-22 ENCOUNTER — Ambulatory Visit
Admission: RE | Admit: 2021-03-22 | Discharge: 2021-03-22 | Disposition: A | Discharge status: Home / Self Care | Payer: Medicare Other | Source: Ambulatory Visit | Attending: Internal Medicine | Admitting: Internal Medicine

## 2021-03-22 DIAGNOSIS — Z139 Encounter for screening, unspecified: Secondary | ICD-10-CM

## 2021-03-22 DIAGNOSIS — Z1231 Encounter for screening mammogram for malignant neoplasm of breast: Secondary | ICD-10-CM | POA: Diagnosis not present

## 2021-11-07 ENCOUNTER — Ambulatory Visit (INDEPENDENT_AMBULATORY_CARE_PROVIDER_SITE_OTHER): Payer: Medicare Other | Admitting: *Deleted

## 2021-11-07 DIAGNOSIS — Z0000 Encounter for general adult medical examination without abnormal findings: Secondary | ICD-10-CM

## 2021-11-07 NOTE — Progress Notes (Signed)
Subjective:   Brittany Miles is a 61 y.o. female who presents for Medicare Annual (Subsequent) preventive examination. I discussed the limitations of evaluation and management by telemedicine and the availability of in person appointments. Patient expressed understanding and agreed to proceed.   Visit performed using audio  Patient:home Provider:home  Review of Systems    Defer to provider  Cardiac Risk Factors include: none     Objective:    There were no vitals filed for this visit. There is no height or weight on file to calculate BMI.  Advanced Directives 11/07/2021 01/27/2019 09/21/2018 09/30/2015 09/28/2015  Does Patient Have a Medical Advance Directive? No No No No No  Would patient like information on creating a medical advance directive? No - Patient declined No - Patient declined No - Patient declined No - patient declined information Yes - Educational materials given    Current Medications (verified) Outpatient Encounter Medications as of 11/07/2021  Medication Sig   atorvastatin (LIPITOR) 20 MG tablet Take 1 tablet (20 mg total) by mouth daily.   carbamazepine (TEGRETOL) 200 MG tablet Take 200 mg by mouth 3 (three) times daily.   gabapentin (NEURONTIN) 800 MG tablet Take 800 mg by mouth 3 (three) times daily.   [DISCONTINUED] furosemide (LASIX) tablet 20 mg    No facility-administered encounter medications on file as of 11/07/2021.    Allergies (verified) Codeine and Penicillins   History: Past Medical History:  Diagnosis Date   FH: cerebral palsy    Seizures ([place])    Past Surgical History:  Procedure Laterality Date   COLONOSCOPY WITH PROPOFOL N/A 09/21/2018   Procedure: COLONOSCOPY WITH PROPOFOL;  Surgeon: Lin Landsman, MD;  Location: [facility] ENDOSCOPY;  Service: Gastroenterology;  Laterality: N/A;   dog bite     Family History  Problem Relation Age of Onset   Breast cancer Neg Hx    Social History   Socioeconomic History   Marital status: Single     Spouse name: Not on file   Number of children: Not on file   Years of education: Not on file   Highest education level: Not on file  Occupational History   Not on file  Tobacco Use   Smoking status: Never   Smokeless tobacco: Never  Substance and Sexual Activity   Alcohol use: No   Drug use: Never   Sexual activity: Not on file  Other Topics Concern   Not on file  Social History Narrative   Not on file   Social Determinants of Health   Financial Resource Strain: Medium Risk   Difficulty of Paying Living Expenses: Somewhat hard  Food Insecurity: No Food Insecurity   Worried About Running Out of Food in the Last Year: Never true   Ran Out of Food in the Last Year: Never true  Transportation Needs: No Transportation Needs   Lack of Transportation (Medical): No   Lack of Transportation (Non-Medical): No  Physical Activity: Sufficiently Active   Days of Exercise per Week: 4 days   Minutes of Exercise per Session: 40 min  Stress: No Stress Concern Present   Feeling of Stress : Not at all  Social Connections: Moderately Isolated   Frequency of Communication with Friends and Family: More than three times a week   Frequency of Social Gatherings with Friends and Family: More than three times a week   Attends Religious Services: 1 to 4 times per year   Active Member of [employer] or Organizations: No   Attends [employer]  or Organization Meetings: Never   Marital Status: Widowed    Tobacco Counseling Counseling given: Not Answered   Clinical Intake:  Pre-visit preparation completed: Yes  Pain : No/denies pain     Diabetes: No  How often do you need to have someone help you when you read instructions, pamphlets, or other written materials from your doctor or pharmacy?: 2 - Rarely What is the last grade level you completed in school?: 12 th grade  Diabetic?no  Interpreter Needed?: No  Information entered by :: Asley Hobart Marte,cma   Activities of Daily Living In your  present state of health, do you have any difficulty performing the following activities: 11/07/2021  Hearing? N  Vision? N  Difficulty concentrating or making decisions? N  Walking or climbing stairs? Y  Dressing or bathing? N  Doing errands, shopping? N  Preparing Food and eating ? N  Using the Toilet? N  In the past six months, have you accidently leaked urine? N  Do you have problems with loss of bowel control? N  Managing your Medications? N  Managing your Finances? N  Housekeeping or managing your Housekeeping? N  Some recent data might be hidden    Patient Care Team: Cletis Athens, MD as PCP - General (Internal Medicine)  Indicate any recent Medical Services you may have received from other than [facility] providers in the past year (date may be approximate).     Assessment:   This is a routine wellness examination for Brittany Miles.  Hearing/Vision screen No results found.  Dietary issues and exercise activities discussed: Current Exercise Habits: Home exercise routine, Type of exercise: walking, Time (Minutes): 40, Frequency (Times/Week): 4, Weekly Exercise (Minutes/Week): 160, Intensity: Mild, Exercise limited by: None identified   Goals Addressed   None    Depression Screen PHQ 2/9 Scores 11/07/2021 07/05/2020  PHQ - 2 Score 0 0    Fall Risk Fall Risk  11/07/2021 07/05/2020  Falls in the past year? 1 1  Number falls in past yr: 1 1  Injury with Fall? 0 1  Risk for fall due to : History of fall(s) History of fall(s);Impaired balance/gait;Impaired mobility  Follow up Falls evaluation completed -    FALL RISK PREVENTION PERTAINING TO THE HOME:  Any stairs in or around the home? Yes  If so, are there any without handrails? No  Home free of loose throw rugs in walkways, pet beds, electrical cords, etc? no Adequate lighting in your home to reduce risk of falls? Yes   ASSISTIVE DEVICES UTILIZED TO PREVENT FALLS:  Life alert? No  Use of a cane, walker or w/c? Yes  Grab bars in  the bathroom? Yes  Shower chair or bench in shower? Yes  Elevated toilet seat or a handicapped toilet? No   TIMED UP AND GO:  Was the test performed? No .  Length of time to ambulate-na    Cognitive Function: MMSE - Mini Mental State Exam 11/07/2021  Not completed: Unable to complete     6CIT Screen 11/07/2021  What Year? 0 points  What month? 0 points  What time? 0 points  Count back from 20 0 points  Months in reverse 0 points  Repeat phrase 0 points  Total Score 0    Immunizations Immunization History  Administered Date(s) Administered   PFIZER(Purple Top)SARS-COV-2 Vaccination 12/21/2020, 01/11/2021    TDAP status: Due, Education has been provided regarding the importance of this vaccine. Advised may receive this vaccine at local pharmacy or Health Dept. Aware  to provide a copy of the vaccination record if obtained from local pharmacy or Health Dept. Verbalized acceptance and understanding.  Flu Vaccine status: Declined, Education has been provided regarding the importance of this vaccine but patient still declined. Advised may receive this vaccine at local pharmacy or Health Dept. Aware to provide a copy of the vaccination record if obtained from local pharmacy or Health Dept. Verbalized acceptance and understanding.  Pneumococcal vaccine status: Declined,  Education has been provided regarding the importance of this vaccine but patient still declined. Advised may receive this vaccine at local pharmacy or Health Dept. Aware to provide a copy of the vaccination record if obtained from local pharmacy or Health Dept. Verbalized acceptance and understanding.   Covid-19 vaccine status: Information provided on how to obtain vaccines.   Qualifies for Shingles Vaccine? Yes   Zostavax completed No   Shingrix Completed?: No.    Education has been provided regarding the importance of this vaccine. Patient has been advised to call insurance company to determine out of pocket expense if  they have not yet received this vaccine. Advised may also receive vaccine at local pharmacy or Health Dept. Verbalized acceptance and understanding.  Screening Tests Health Maintenance  Topic Date Due   HIV Screening  Never done   Hepatitis C Screening  Never done   COVID-19 Vaccine (3 - Pfizer risk series) 02/08/2021   INFLUENZA VACCINE  01/31/2022 (Originally 06/03/2021)   Zoster Vaccines- Shingrix (1 of 2) 02/05/2022 (Originally 02/21/1981)   Pneumococcal Vaccine 35-38 Years old (1 - PCV) 11/07/2022 (Originally 02/22/1968)   TETANUS/TDAP  11/07/2022 (Originally 02/21/1981)   PAP SMEAR-Modifier  11/03/2022   MAMMOGRAM  03/23/2023   COLONOSCOPY (Pts 45-12yrs Insurance coverage will need to be confirmed)  09/21/2028   HPV VACCINES  Aged Out    Health Maintenance  Health Maintenance Due  Topic Date Due   HIV Screening  Never done   Hepatitis C Screening  Never done   COVID-19 Vaccine (3 - Pfizer risk series) 02/08/2021    Colorectal cancer screening: Type of screening: Colonoscopy. Completed 09/21/2018. Repeat every 10 years  Mammogram status: Completed 12/23/2020. Repeat every year    Lung Cancer Screening: (Low Dose CT Chest recommended if Age 89-80 years, 30 pack-year currently smoking OR have quit w/in 15years.) does not qualify.   Lung Cancer Screening Referral: NA  Additional Screening:  Hepatitis C Screening: does qualify will be completed with next labs   Vision Screening: Recommended annual ophthalmology exams for early detection of glaucoma and other disorders of the eye. Is the patient up to date with their annual eye exam?  Yes  Who is the provider or what is the name of the office in which the patient attends annual eye exams? [place] If pt is not established with a provider, would they like to be referred to a provider to establish care? No .   Dental Screening: Recommended annual dental exams for proper oral hygiene  Community Resource Referral / Chronic Care  Management: CRR required this visit?  No   CCM required this visit?  No      Plan:     I have personally reviewed and noted the following in the patients chart:   Medical and social history Use of alcohol, tobacco or illicit drugs  Current medications and supplements including opioid prescriptions.  Functional ability and status Nutritional status Physical activity Advanced directives List of other physicians Hospitalizations, surgeries, and ER visits in previous 12 months Vitals Screenings to  include cognitive, depression, and falls Referrals and appointments  In addition, I have reviewed and discussed with patient certain preventive protocols, quality metrics, and best practice recommendations. A written personalized care plan for preventive services as well as general preventive health recommendations were provided to patient.     Lacretia Nicks, [place]   11/07/2021   Nurse Notes:  Ms. Rinn , Thank you for taking time to come for your Medicare Wellness Visit. I appreciate your ongoing commitment to your health goals. Please review the following plan we discussed and let me know if I can assist you in the future.   These are the goals we discussed:  Goals   None     This is a list of the screening recommended for you and due dates:  Health Maintenance  Topic Date Due   HIV Screening  Never done   Hepatitis C Screening: USPSTF Recommendation to screen - Ages 7-79 yo.  Never done   COVID-19 Vaccine (3 - Pfizer risk series) 02/08/2021   Flu Shot  01/31/2022*   Zoster (Shingles) Vaccine (1 of 2) 02/05/2022*   Pneumococcal Vaccination (1 - PCV) 11/07/2022*   Tetanus Vaccine  11/07/2022*   Pap Smear  11/03/2022   Mammogram  03/23/2023   Colon Cancer Screening  09/21/2028   HPV Vaccine  Aged Out  *Topic was postponed. The date shown is not the original due date.       Time spent 30 min

## 2021-11-08 NOTE — Progress Notes (Signed)
I have reviewed this visit and agree with the documentation.   

## 2022-01-06 DIAGNOSIS — E038 Other specified hypothyroidism: Secondary | ICD-10-CM | POA: Diagnosis not present

## 2022-01-06 DIAGNOSIS — E538 Deficiency of other specified B group vitamins: Secondary | ICD-10-CM | POA: Diagnosis not present

## 2022-01-06 DIAGNOSIS — E559 Vitamin D deficiency, unspecified: Secondary | ICD-10-CM | POA: Diagnosis not present

## 2022-01-06 DIAGNOSIS — Z79899 Other long term (current) drug therapy: Secondary | ICD-10-CM | POA: Diagnosis not present

## 2022-04-04 IMAGING — MG MM DIGITAL SCREENING BILAT W/ TOMO AND CAD
6 of 10 series · 6 of 30 positions shown · non-contrast
Comparison: Previous exam(s).

CLINICAL DATA: Screening.

EXAM:
DIGITAL SCREENING BILATERAL MAMMOGRAM WITH TOMOSYNTHESIS AND CAD
TECHNIQUE: Bilateral screening digital craniocaudal and mediolateral oblique
mammograms were obtained. Bilateral screening digital breast
tomosynthesis was performed. The images were evaluated with
computer-aided detection.

[R MLO synth-2D]
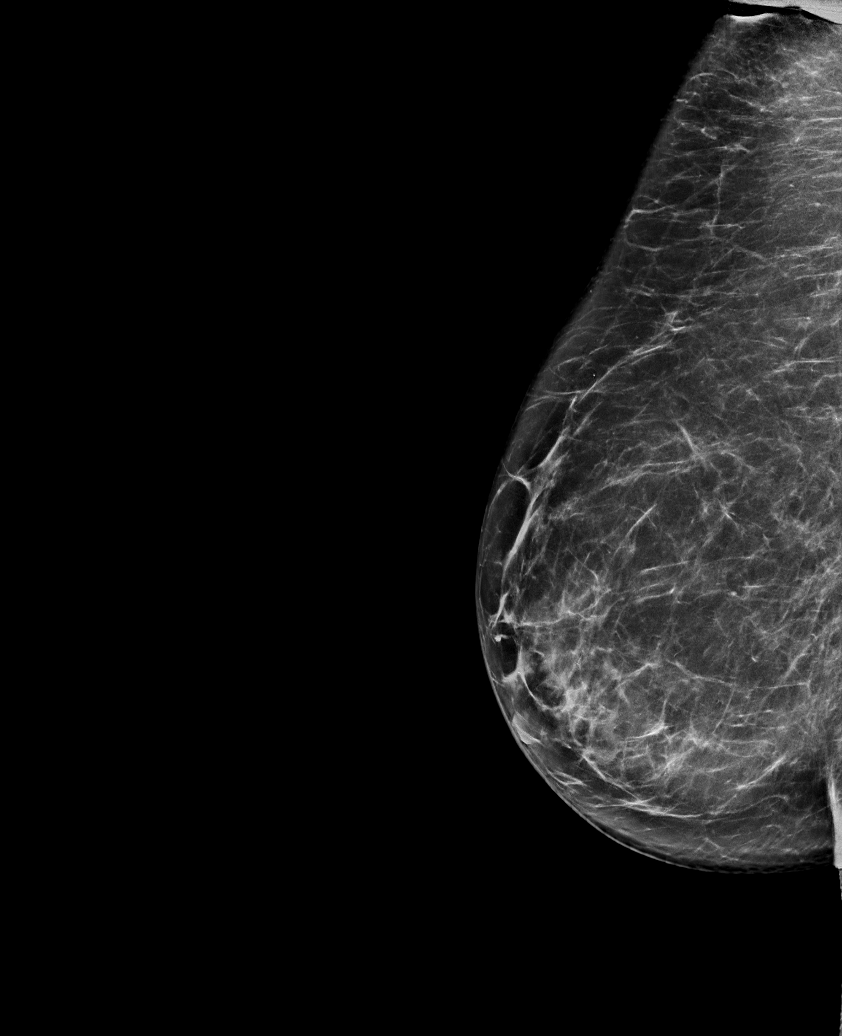

[L MLO synth-2D]
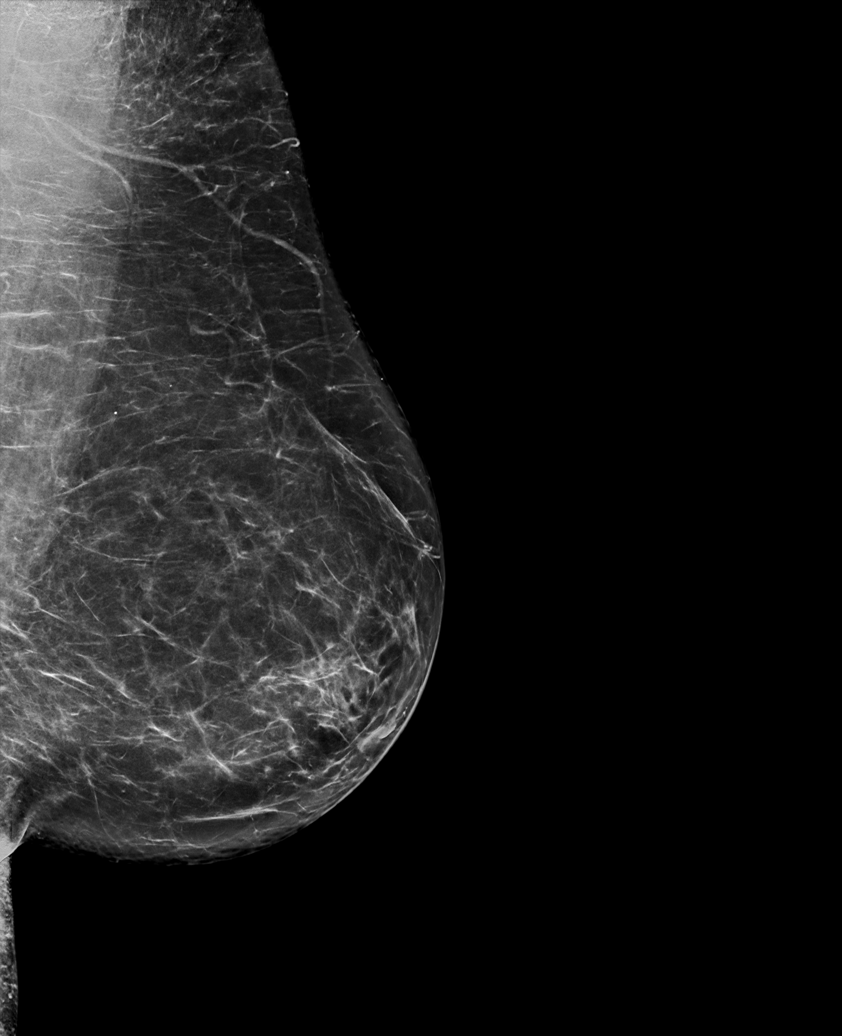

[L CC synth-2D]
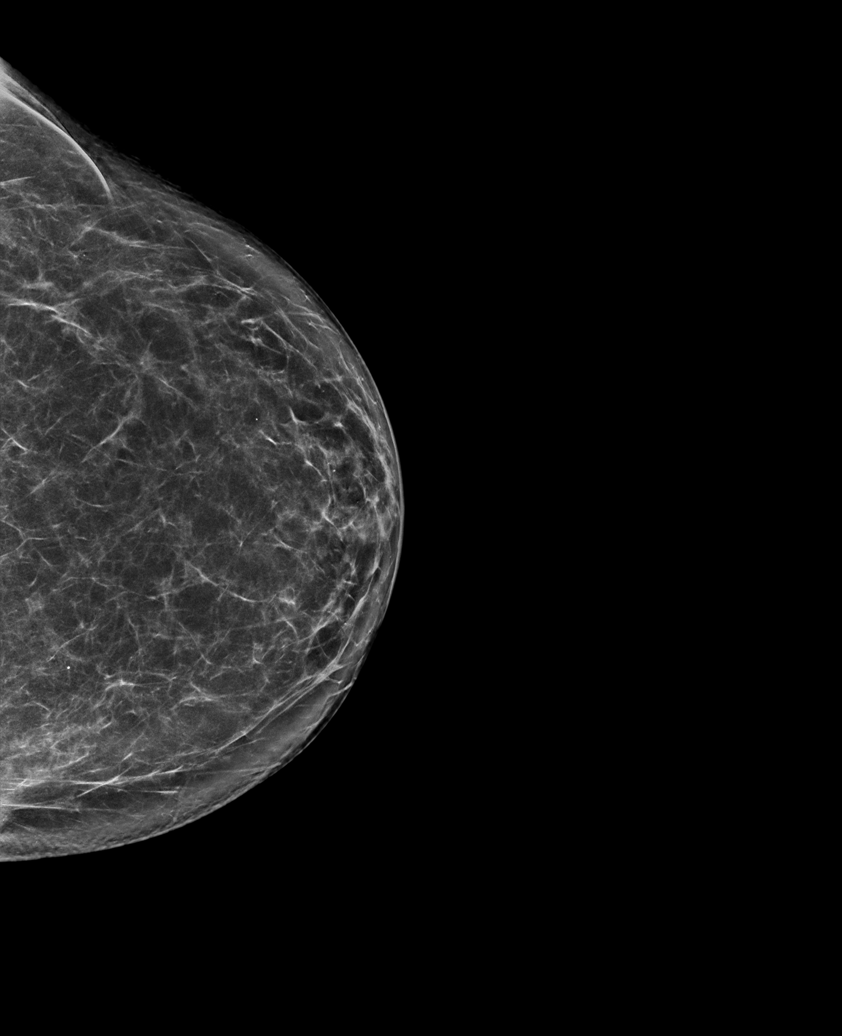

[R CC synth-2D (1 of 2)]
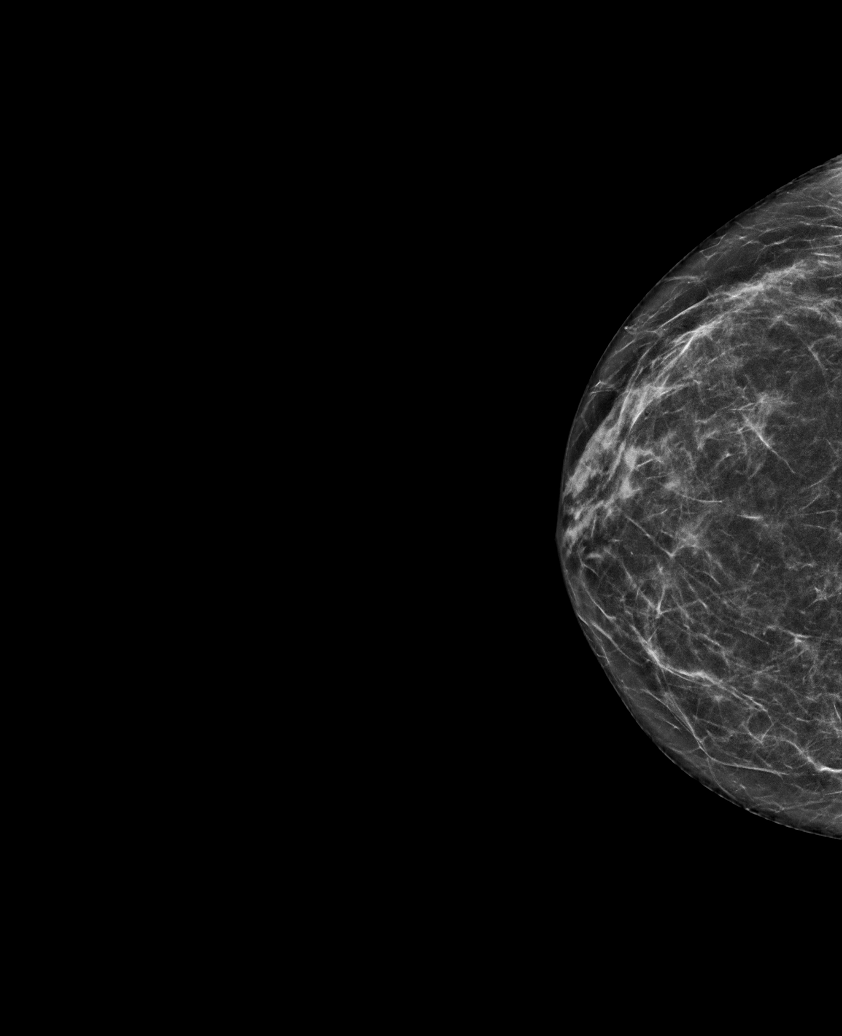

[R CC synth-2D (2 of 2)]
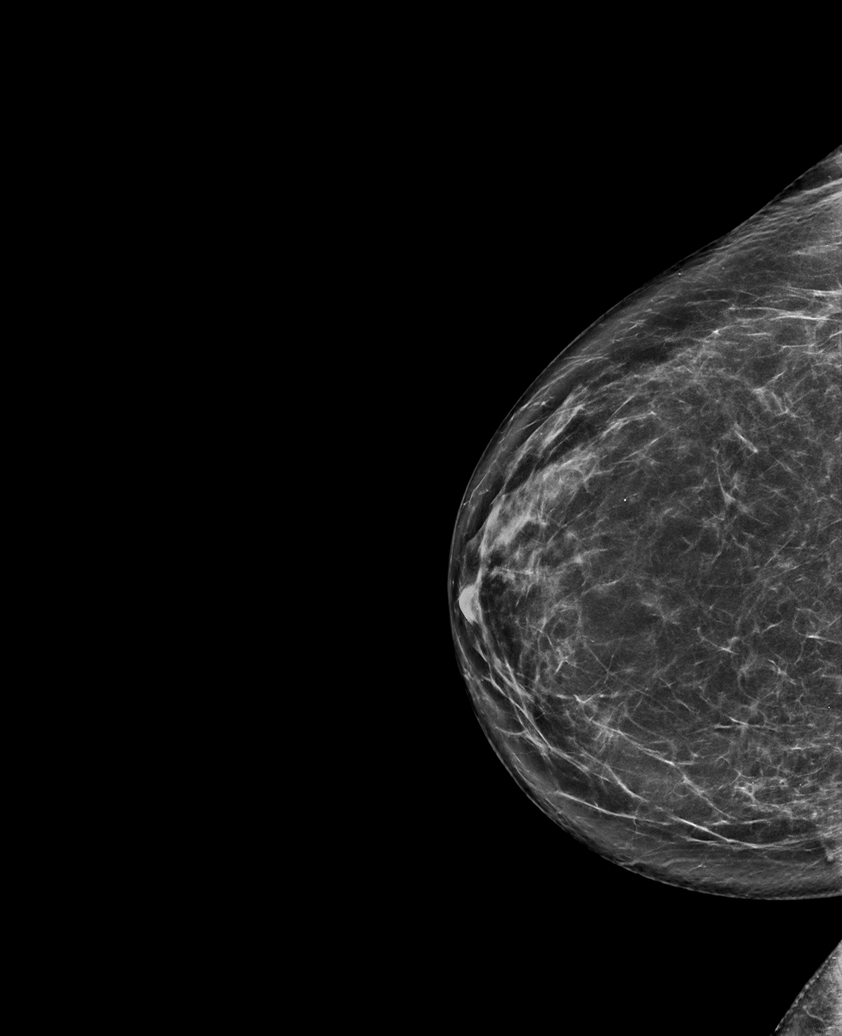

[R CC tomo · tomo slice 33/65.0]
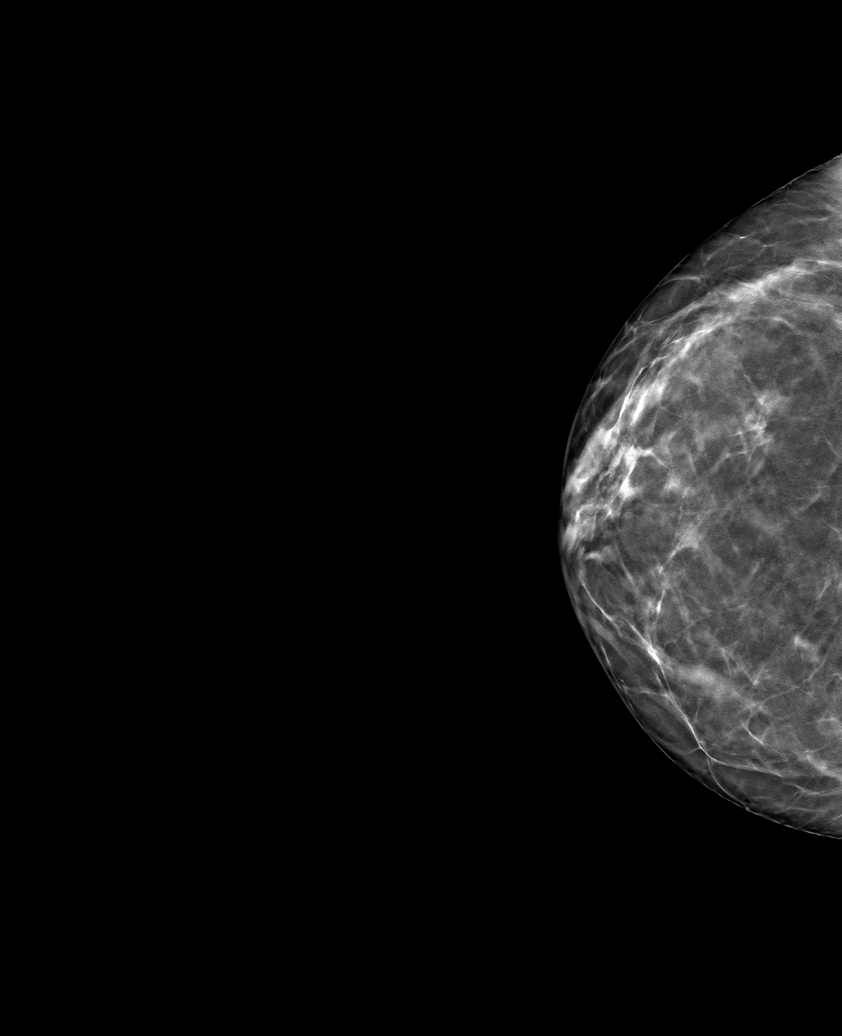

[6 of 30 positions shown; findings below may reference images not displayed]

ACR Breast Density Category b: There are scattered areas of
fibroglandular density.
FINDINGS: There are no findings suspicious for malignancy. The images were
evaluated with computer-aided detection.
IMPRESSION: No mammographic evidence of malignancy. A result letter of this
screening mammogram will be mailed directly to the patient.

RECOMMENDATION:
Screening mammogram in one year. (Code:[id number])

BI-RADS CATEGORY  1: Negative.

## 2022-05-29 ENCOUNTER — Other Ambulatory Visit: Payer: Self-pay

## 2022-05-29 MED ORDER — METHYLPREDNISOLONE 4 MG PO TBPK: ORAL_TABLET | ORAL | 0 refills | Status: DC

## 2022-06-03 ENCOUNTER — Ambulatory Visit (INDEPENDENT_AMBULATORY_CARE_PROVIDER_SITE_OTHER): Payer: Medicare Other | Admitting: *Deleted

## 2022-06-03 DIAGNOSIS — L237 Allergic contact dermatitis due to plants, except food: Secondary | ICD-10-CM

## 2022-06-03 MED ORDER — TRIAMCINOLONE ACETONIDE 40 MG/ML IJ SUSP
40.0000 mg | Freq: Once | INTRAMUSCULAR | Status: AC
  Administered 2022-06-03: 40 mg via INTRAMUSCULAR

## 2022-06-03 NOTE — Progress Notes (Signed)
Patient here with poison ivy for a steroid shot. Patient advised to call back in a couple of days if no improvement.

## 2022-06-17 ENCOUNTER — Other Ambulatory Visit: Payer: Self-pay | Admitting: Internal Medicine

## 2023-01-08 DIAGNOSIS — Z79899 Other long term (current) drug therapy: Secondary | ICD-10-CM | POA: Diagnosis not present

## 2023-03-19 DIAGNOSIS — Z79899 Other long term (current) drug therapy: Secondary | ICD-10-CM | POA: Diagnosis not present

## 2023-03-19 LAB — HM HEPATITIS C SCREENING LAB: HM Hepatitis Screen: NEGATIVE

## 2023-05-14 ENCOUNTER — Ambulatory Visit (INDEPENDENT_AMBULATORY_CARE_PROVIDER_SITE_OTHER): Payer: Medicare Other | Admitting: Internal Medicine

## 2023-05-14 ENCOUNTER — Encounter: Payer: Self-pay | Admitting: Internal Medicine

## 2023-05-14 VITALS — BP 116/80 | HR 77 | Temp 97.7°F | Resp 18 | Ht 62.0 in | Wt 211.3 lb

## 2023-05-14 DIAGNOSIS — Z1231 Encounter for screening mammogram for malignant neoplasm of breast: Secondary | ICD-10-CM

## 2023-05-14 DIAGNOSIS — G809 Cerebral palsy, unspecified: Secondary | ICD-10-CM | POA: Diagnosis not present

## 2023-05-14 DIAGNOSIS — Z114 Encounter for screening for human immunodeficiency virus [HIV]: Secondary | ICD-10-CM | POA: Diagnosis not present

## 2023-05-14 DIAGNOSIS — Z13220 Encounter for screening for lipoid disorders: Secondary | ICD-10-CM

## 2023-05-14 NOTE — Progress Notes (Signed)
New Patient Office Visit  Subjective    Patient ID: Brittany Miles, female    DOB: 1961-01-31  Age: 62 y.o. MRN: [id number]  CC:  Chief Complaint  Patient presents with   Establish Care    See's specialist neuro    HPI Brittany Miles presents to establish care.  She is originally from [place] but has been living down here for the last 8 years.  CP/Seizure Disorder: -Following with Dr. Malvin Johns, Neurology, last seen 01/08/23 -Is currently on Gabapentin 800 mg TID and Tegretol 200 mg in the am, 150 mg in the evening   Vitamin B12 Deficiency:  -Currently on B complex vitamins  -Last levels 3/23 low, planning to recheck them with Dr. Malvin Johns in September  Vitamin D Deficiency: -Currently on 5000 international units new since March  HLD: -Medications: Nothing currently but had been on atorvastatin in the past which she tolerated well. -Last lipid panel: Lipid Panel     Component Value Date/Time   CHOL 247 (H) 07/05/2020 1055   TRIG 61 07/05/2020 1055   HDL 87 07/05/2020 1055   CHOLHDL 2.8 07/05/2020 1055   VLDL 7 10/22/2016 1240   LDLCALC 144 (H) 07/05/2020 1055   The 10-year ASCVD risk score (Arnett DK, et al., 2019) is: 2.7%   Values used to calculate the score:     Age: 86 years     Sex: Female     Is Non-Hispanic African American: No     Diabetic: No     Tobacco smoker: No     Systolic Blood Pressure: 116 mmHg     Is BP treated: No     HDL Cholesterol: 87 mg/dL     Total Cholesterol: 247 mg/dL  Health Maintenance: -Blood work due -Mammogram due -Colonoscopy 09/2018, repeat in 10 years    Outpatient Encounter Medications as of 05/14/2023  Medication Sig   carbamazepine (TEGRETOL) 200 MG tablet Take 200 mg by mouth 3 (three) times daily. 200 in am 150 at night   gabapentin (NEURONTIN) 800 MG tablet Take 800 mg by mouth 3 (three) times daily.   [DISCONTINUED] atorvastatin (LIPITOR) 20 MG tablet Take 1 tablet by mouth once daily   [DISCONTINUED]  methylPREDNISolone (MEDROL DOSEPAK) 4 MG TBPK tablet Use as directed   No facility-administered encounter medications on file as of 05/14/2023.    Past Medical History:  Diagnosis Date   FH: cerebral palsy    Seizures (HCC)     Past Surgical History:  Procedure Laterality Date   ANKLE SURGERY     as child   COLONOSCOPY WITH PROPOFOL N/A 09/21/2018   Procedure: COLONOSCOPY WITH PROPOFOL;  Surgeon: Toney Reil, MD;  Location: [facility] ENDOSCOPY;  Service: Gastroenterology;  Laterality: N/A;   dog bite      Family History  Problem Relation Age of Onset   Aneurysm Mother    Cancer Father    Breast cancer Neg Hx     Social History   Socioeconomic History   Marital status: Widowed    Spouse name: Not on file   Number of children: Not on file   Years of education: Not on file   Highest education level: Not on file  Occupational History   Not on file  Tobacco Use   Smoking status: Never   Smokeless tobacco: Never  Vaping Use   Vaping status: Never Used  Substance and Sexual Activity   Alcohol use: No   Drug use: Never   Sexual activity: Yes  Other Topics Concern   Not on file  Social History Narrative   Not on file   Social Determinants of Health   Financial Resource Strain: Medium Risk (11/07/2021)   Overall Financial Resource Strain (CARDIA)    Difficulty of Paying Living Expenses: Somewhat hard  Food Insecurity: No Food Insecurity (11/07/2021)   Hunger Vital Sign    Worried About Running Out of Food in the Last Year: Never true    Ran Out of Food in the Last Year: Never true  Transportation Needs: No Transportation Needs (11/07/2021)   PRAPARE - Administrator, Civil Service (Medical): No    Lack of Transportation (Non-Medical): No  Physical Activity: Sufficiently Active (11/07/2021)   Exercise Vital Sign    Days of Exercise per Week: 4 days    Minutes of Exercise per Session: 40 min  Stress: No Stress Concern Present (11/07/2021)   [employer] of Occupational Health - Occupational Stress Questionnaire    Feeling of Stress : Not at all  Social Connections: Moderately Isolated (11/07/2021)   Social Connection and Isolation Panel [NHANES]    Frequency of Communication with Friends and Family: More than three times a week    Frequency of Social Gatherings with Friends and Family: More than three times a week    Attends Religious Services: 1 to 4 times per year    Active Member of [employer] or Organizations: No    Attends Banker Meetings: Never    Marital Status: Widowed  Intimate Partner Violence: Not At Risk (11/07/2021)   Humiliation, Afraid, Rape, and Kick questionnaire    Fear of Current or Ex-Partner: No    Emotionally Abused: No    Physically Abused: No    Sexually Abused: No    Review of Systems  Neurological:  Negative for seizures.  All other systems reviewed and are negative.       Objective    BP 116/80   Pulse 77   Temp 97.7 F (36.5 C)   Resp 18   Ht 5\' 2"  (1.575 m)   Wt 211 lb 4.8 oz (95.8 kg)   SpO2 97%   BMI 38.65 kg/m   Physical Exam Constitutional:      Appearance: Normal appearance.  HENT:     Head: Normocephalic and atraumatic.     Mouth/Throat:     Mouth: Mucous membranes are moist.     Pharynx: Oropharynx is clear.  Eyes:     Extraocular Movements: Extraocular movements intact.     Conjunctiva/sclera: Conjunctivae normal.     Pupils: Pupils are equal, round, and reactive to light.  Neck:     Comments: No thyromegaly Cardiovascular:     Rate and Rhythm: Normal rate and regular rhythm.  Pulmonary:     Effort: Pulmonary effort is normal.     Breath sounds: Normal breath sounds.  Musculoskeletal:     Cervical back: No tenderness.     Right lower leg: Edema present.     Left lower leg: Edema present.     Comments: Has brace on lower right extremity  Lymphadenopathy:     Cervical: No cervical adenopathy.  Skin:    General: Skin is warm and dry.  Neurological:      General: No focal deficit present.     Mental Status: She is alert. Mental status is at baseline.  Psychiatric:        Mood and Affect: Mood normal.  Behavior: Behavior normal.     Last CBC Lab Results  Component Value Date   WBC 4.0 07/05/2020   HGB 12.4 07/05/2020   HCT 37.1 07/05/2020   MCV 86.7 07/05/2020   MCH 29.0 07/05/2020   RDW 12.8 07/05/2020   PLT 259 07/05/2020   Last metabolic panel Lab Results  Component Value Date   GLUCOSE 86 07/05/2020   NA 138 10/05/2020   K 4.2 10/05/2020   CL 97 (L) 10/05/2020   CO2 27 10/05/2020   BUN 8 07/05/2020   CREATININE 0.68 07/05/2020   GFRNONAA 96 07/05/2020   CALCIUM 9.0 07/05/2020   PROT 7.1 07/05/2020   BILITOT 0.3 07/05/2020   AST 22 07/05/2020   ALT 11 07/05/2020   ANIONGAP 8 10/22/2016   Last lipids Lab Results  Component Value Date   CHOL 247 (H) 07/05/2020   HDL 87 07/05/2020   LDLCALC 144 (H) 07/05/2020   TRIG 61 07/05/2020   CHOLHDL 2.8 07/05/2020   Last hemoglobin A1c No results found for: "HGBA1C" Last thyroid functions Lab Results  Component Value Date   TSH 2.60 07/05/2020   Last vitamin D No results found for: "25OHVITD2", "25OHVITD3", "VD25OH" Last vitamin B12 and Folate No results found for: "VITAMINB12", "FOLATE"      Assessment & Plan:   1. Cerebral palsy, unspecified type [facility]): Following with neurology note and labs reviewed from 01/08/2023.  She is currently on gabapentin 800 mg 3 times daily and Tegretol 200 mg in the morning and 150 in the evening.  She was also started on vitamin B12 and vitamin D in March with plans to recheck levels in September at her next neurology appointment.  2. Lipid screening: Has been on atorvastatin in the past, tolerated well but states prescription ran out.  Her last LDL in 2021 was elevated at 144, however her ASCVD risk is low at 2.3% today.  Will recheck fasting cholesterol today to determine if she needs a statin.  - Lipid Profile  3.  Screening for HIV without presence of risk factors: Screening due.  - HIV antibody (with reflex)  4. Encounter for screening mammogram for malignant neoplasm of breast: Mammogram ordered.  - MM 3D SCREENING MAMMOGRAM BILATERAL BREAST; Future   Return in 1 year (on 05/13/2024) for 1 year medical follow up, also due for AWV.   Margarita Mail, DO

## 2023-05-15 ENCOUNTER — Telehealth: Payer: Self-pay | Admitting: Internal Medicine

## 2023-05-15 LAB — LIPID PANEL
Cholesterol: 236 mg/dL — ABNORMAL HIGH (ref <200)
HDL: 80 mg/dL (ref >=50)
LDL Cholesterol (Calc): 141 mg/dL (calc) — ABNORMAL HIGH
Non-HDL Cholesterol (Calc): 156 mg/dL (calc) — ABNORMAL HIGH (ref <130)
Total CHOL/HDL Ratio: 3 (calc) (ref <5.0)
Triglycerides: 60 mg/dL (ref <150)

## 2023-05-15 LAB — HIV ANTIBODY (ROUTINE TESTING W REFLEX): HIV 1&2 Ab, 4th Generation: NONREACTIVE

## 2023-05-15 NOTE — Telephone Encounter (Signed)
Pt. Given lab work and instructions, verbalizes understanding.

## 2023-05-28 ENCOUNTER — Ambulatory Visit
Admission: RE | Admit: 2023-05-28 | Discharge: 2023-05-28 | Disposition: A | Discharge status: Home / Self Care | Payer: Medicare Other | Source: Ambulatory Visit | Attending: Internal Medicine | Admitting: Internal Medicine

## 2023-05-28 DIAGNOSIS — Z1231 Encounter for screening mammogram for malignant neoplasm of breast: Secondary | ICD-10-CM | POA: Diagnosis not present

## 2023-06-26 ENCOUNTER — Ambulatory Visit: Payer: Self-pay

## 2023-06-26 ENCOUNTER — Encounter: Payer: Self-pay | Admitting: Internal Medicine

## 2023-06-26 ENCOUNTER — Ambulatory Visit (INDEPENDENT_AMBULATORY_CARE_PROVIDER_SITE_OTHER): Payer: Medicare Other | Admitting: Internal Medicine

## 2023-06-26 VITALS — BP 118/80 | HR 85 | Temp 97.8°F | Ht 62.0 in | Wt 208.6 lb

## 2023-06-26 DIAGNOSIS — T783XXA Angioneurotic edema, initial encounter: Secondary | ICD-10-CM

## 2023-06-26 MED ORDER — CETIRIZINE HCL 10 MG PO TABS
10.0000 mg | ORAL_TABLET | Freq: Every day | ORAL | 0 refills | Status: DC
Start: 2023-06-26 — End: 2024-05-16

## 2023-06-26 MED ORDER — DEXAMETHASONE SODIUM PHOSPHATE 10 MG/ML IJ SOLN
10.0000 mg | Freq: Once | INTRAMUSCULAR | Status: AC
Start: 2023-06-26 — End: 2023-06-26
  Administered 2023-06-26: 10 mg via INTRAMUSCULAR

## 2023-06-26 NOTE — Progress Notes (Signed)
   Acute Office Visit  Subjective:     Patient ID: Brittany Miles, female    DOB: 31-Jul-1961, 62 y.o.   MRN: [id number]  Chief Complaint  Patient presents with   Rash    3 days, benadryl, itchy, chest, mainly around eyes    HPI Patient is in today for skin irritation. Patient states that 3 days ago she was out in the yard gardening and thinks she came into contact with poison ivy. Started with a mild itchy rash on left cheek, the next day she woke up with swelling on her face, worse around her eyes. Is taking Bendaryl which has mildly improved symptoms. Denies issues with airway, no shortness of breath or difficulty breathing. Denies taking any other medications, nothing over the counter, no new foods or personal products.   Review of Systems  Constitutional:  Negative for chills and fever.  Eyes:  Negative for blurred vision, double vision, pain and discharge.  Respiratory:  Negative for shortness of breath.   Cardiovascular:  Negative for chest pain.  Skin:  Positive for itching and rash.        Objective:    BP 118/80   Pulse 85   Temp 97.8 F (36.6 C)   Ht 5\' 2"  (1.575 m)   Wt 208 lb 9.6 oz (94.6 kg)   SpO2 98%   BMI 38.15 kg/m    Physical Exam Constitutional:      Appearance: Normal appearance.  HENT:     Head: Normocephalic and atraumatic.     Comments: Moderate angioedema present    Mouth/Throat:     Mouth: Mucous membranes are moist.     Pharynx: Oropharynx is clear.  Eyes:     Extraocular Movements: Extraocular movements intact.     Conjunctiva/sclera: Conjunctivae normal.     Pupils: Pupils are equal, round, and reactive to light.  Cardiovascular:     Rate and Rhythm: Normal rate and regular rhythm.  Pulmonary:     Effort: Pulmonary effort is normal.     Breath sounds: Normal breath sounds.  Skin:    General: Skin is warm and dry.     Findings: Rash present.  Neurological:     General: No focal deficit present.     Mental Status: She is alert.  Mental status is at baseline.  Psychiatric:        Mood and Affect: Mood normal.        Behavior: Behavior normal.     No results found for any visits on 06/26/23.      Assessment & Plan:   1. Angioedema, initial encounter: No airway symptoms, IM steroids administered, patient will continue Benadryl every 8 hours until symptoms resolve, will start daily oral anti-histamine as well. Will follow up if symptoms worsen or fail to improve.   - cetirizine (ZYRTEC ALLERGY) 10 MG tablet; Take 1 tablet (10 mg total) by mouth daily.  Dispense: 90 tablet; Refill: 0 - dexamethasone (DECADRON) injection 10 mg   Return if symptoms worsen or fail to improve.  Margarita Mail, DO

## 2023-06-26 NOTE — Telephone Encounter (Signed)
     Chief Complaint: Poison ivy rash to face and hands. States eyelids are swollen. No SOB or difficulty swallowing. Symptoms: Itchy Frequency: 2 days ago Pertinent Negatives: Patient denies  Disposition: [] ED /[] Urgent Care (no appt availability in office) / [x] Appointment(In office/virtual)/ []  [facility]/ [] Home Care/ [] Refused Recommended Disposition /[] [facility]/ []  Follow-up with PCP Additional Notes: Pt. Agrees with appointment today.  Reason for Disposition  [1] Severe poison ivy, oak, or sumac reaction in the past AND [2] face or genitals involved  Answer Assessment - Initial Assessment Questions 1. APPEARANCE of RASH: "Describe the rash."      Puffy red 2. LOCATION: "Where is the rash located?"  (e.g., face, genitals, hands, legs)     Face and hands 3. SIZE: "How large is the rash?"      Unsure 4. ONSET: "When did the rash begin?"      2 days ago 5. ITCHING: "Does the rash itch?" If Yes, ask: "How bad is it?"   - MILD - doesn't interfere with normal activities   - MODERATE-SEVERE: interferes with work, school, sleep, or other activities      Moderate 6. EXPOSURE:  "How were you exposed to the plant (poison ivy, poison oak, sumac)"  "When were you exposed?"       Yes 7. PAST HISTORY: "Have you had a poison ivy rash before?" If Yes, ask: "How bad was it?"     Yes 8. PREGNANCY: "Is there any chance you are pregnant?" "When was your last menstrual period?"     No  Protocols used: Poison Ivy - Oak - [facility]

## 2023-06-26 NOTE — Patient Instructions (Addendum)
It was great seeing you today!  Plan discussed at today's visit: -Steroid injection today -Start oral anti-histamine like Zytrec daily, please take Benadryl every 8 hours until symptoms resolve   Follow up in: as needed  Take care and let us know if you have any questions or concerns prior to your next visit.  Dr. Caralee Ates  Angioedema Angioedema is swelling in the body. The swelling can occur in any part of the body. It can affect any part of the body, including the legs, hands, genitals, face, mouth, lips, and organs. It may cause itchy, red, swollen areas of skin (hives) to form. This condition may: Happen only one time. Happen more than one time. It can also stop at any time. Keep coming back for a number of years. Someday it may stop. What are the causes? This condition may be caused by: Foods, such as milk, eggs, shellfish, wheat, or nuts. Medicines, such as ACE inhibitors, antibiotics, birth control pills, dyes used in X-rays or NSAIDs such as ibuprofen. Hereditary angioedema (HAE) is passed from parent to child. Symptoms can occur because of: Illness. Infection. Stress. Changes in hormones. Exercise. Minor surgery. Dental work. In some cases, the cause of this condition may not be known. What increases the risk? You are more likely to have HAE if you have family members with this condition. What are the signs or symptoms? Symptoms of this condition include: Swollen skin. Itchy, red, swollen areas of skin. Pain, pressure, or tenderness in the affected area. Swollen eyelids, face, lips, or tongue. Trouble drinking, swallowing, or fully closing the mouth. Being hoarse or having a sore throat. Wheezing. Trouble breathing. If your organs are affected, you may: Feel like vomiting. Have pain in your belly (abdomen). Vomit or have watery poop (diarrhea). Have trouble swallowing. Have trouble peeing. How is this treated? To treat this condition, you may be told: To  avoid things that cause attacks (triggers). These include foods or things that cause allergies. To stop medicines that cause the condition. To take medicines to treat the condition. In very bad cases, a breathing tube or a machine that helps with breathing (ventilator) may be used. Follow these instructions at home:  Take all medicines only as told by your doctor. If you were given medicines to treat allergies, always carry them with you. Wear a medical bracelet as told by your doctor. Avoid the things that cause attacks. These may include: Foods. Things in your environment (such as pollen). Stress. Exercise. Avoid all medicines that caused the attacks. Talk to your doctor before you have kids. Some types of this condition may be passed from parent to child. Where to find more information American Academy of Allergy Asthma & Immunology: www.aaaai.org Contact a doctor if: You have another attack. Your attacks happen more often, even after you take steps to prevent them. Your attacks are worse every time they occur. You are thinking about having kids. Get help right away if: Your mouth, tongue, or lips get very swollen. Your swelling gets worse. You have trouble breathing or swallowing. You have trouble talking. You have chest pain. You feel dizzy. You feel light-headed. You faint. These symptoms may be an emergency. Get help right away. Call your local emergency services (911 in the [place] Do not wait to see if the symptoms will go away. Do not drive yourself to the hospital. Summary Angioedema is swelling in the body. Angioedema can be caused by the food you eat or the medicines you take. Avoid the  things that cause your attacks. These can be food, medicines, or things in your environment. If you were given medicines for allergies, always carry them with you. Get help right away if your mouth, tongue, or lips get swollen. Also, get help right away if you have trouble breathing  or swallowing. This information is not intended to replace advice given to you by your health care provider. Make sure you discuss any questions you have with your health care provider. Document Revised: 02/20/2021 Document Reviewed: 02/20/2021 Elsevier Patient Education  2024 [employer].

## 2023-06-29 ENCOUNTER — Ambulatory Visit (INDEPENDENT_AMBULATORY_CARE_PROVIDER_SITE_OTHER): Payer: Medicare Other | Admitting: Nurse Practitioner

## 2023-06-29 ENCOUNTER — Other Ambulatory Visit: Payer: Self-pay

## 2023-06-29 ENCOUNTER — Encounter: Payer: Self-pay | Admitting: Nurse Practitioner

## 2023-06-29 ENCOUNTER — Other Ambulatory Visit: Payer: Self-pay | Admitting: Nurse Practitioner

## 2023-06-29 VITALS — BP 122/68 | HR 86 | Temp 97.8°F | Resp 16 | Ht 62.0 in | Wt 209.3 lb

## 2023-06-29 DIAGNOSIS — L237 Allergic contact dermatitis due to plants, except food: Secondary | ICD-10-CM

## 2023-06-29 DIAGNOSIS — T783XXD Angioneurotic edema, subsequent encounter: Secondary | ICD-10-CM | POA: Diagnosis not present

## 2023-06-29 MED ORDER — PREDNISONE 10 MG (21) PO TBPK
ORAL_TABLET | ORAL | 0 refills | Status: DC
Start: 2023-06-29 — End: 2024-05-16

## 2023-06-29 MED ORDER — FAMOTIDINE 20 MG PO TABS
20.0000 mg | ORAL_TABLET | Freq: Two times a day (BID) | ORAL | 0 refills | Status: AC
Start: 2023-06-29 — End: ?

## 2023-06-29 NOTE — Progress Notes (Signed)
BP 122/68   Pulse 86   Temp 97.8 F (36.6 C) (Oral)   Resp 16   Ht 5\' 2"  (1.575 m)   Wt 209 lb 4.8 oz (94.9 kg)   SpO2 98%   BMI 38.28 kg/m    Subjective:    Patient ID: Brittany Miles, female    DOB: 08-24-1961, 62 y.o.   MRN: [id number]  HPI: Brittany Miles is a 62 y.o. female  Chief Complaint  Patient presents with   Poison Ivy    Follow up   Angioedema: patient was seen by Dr. Caralee Ates on 06/26/2023 for same.  Patient reported that she was working in her yard and had come in contact with poison ivy.  She had been taking benadryl which helped slightly. She was given decadron injection. Started on zyrtec and told to continue benadryl every 8 hours.  Patient here today reporting that she is a little better but now she has a rash all over. She says her eyes were swollen shut but they are not today. She says that her face does feel puffy. She has been taking benadryl and zyrtec.  She denies any new foods, soaps.  She did recently start zyrtec but she already had the angioedema prior to taking it.   Recommend pepcid, steroid taper and continued benadryl.  Will also place referral to allergist.  Patient aware if having any worsening symptoms including, shortness of breath, swelling to tongue to seek emergency care.   Relevant past medical, surgical, family and social history reviewed and updated as indicated. Interim medical history since our last visit reviewed. Allergies and medications reviewed and updated.  Review of Systems  Constitutional: Negative for fever or weight change.  Respiratory: Negative for cough and shortness of breath.   Cardiovascular: Negative for chest pain or palpitations.  Gastrointestinal: Negative for abdominal pain, no bowel changes.  Musculoskeletal: Negative for gait problem or joint swelling.  Skin: positive for rash.  Neurological: Negative for dizziness or headache.  No other specific complaints in a complete review of systems (except as listed  in HPI above).      Objective:    BP 122/68   Pulse 86   Temp 97.8 F (36.6 C) (Oral)   Resp 16   Ht 5\' 2"  (1.575 m)   Wt 209 lb 4.8 oz (94.9 kg)   SpO2 98%   BMI 38.28 kg/m   Wt Readings from Last 3 Encounters:  06/29/23 209 lb 4.8 oz (94.9 kg)  06/26/23 208 lb 9.6 oz (94.6 kg)  05/14/23 211 lb 4.8 oz (95.8 kg)    Physical Exam  Constitutional: Patient appears well-developed and well-nourished. Obese  No distress.  HEENT: head atraumatic, normocephalic, pupils equal and reactive to light, neck supple, throat within normal limits Cardiovascular: Normal rate, regular rhythm and normal heart sounds.  No murmur heard. No BLE edema. Pulmonary/Chest: Effort normal and breath sounds normal. No respiratory distress. Abdominal: Soft.  There is no tenderness. Skin : hives to arms,  swelling to face, no swelling to tongue Psychiatric: Patient has a normal mood and affect. behavior is normal. Judgment and thought content normal.  Results for orders placed or performed in visit on 05/14/23  HM HEPATITIS C SCREENING LAB  Result Value Ref Range   HM Hepatitis Screen Negative-Validated       Assessment & Plan:   Problem List Items Addressed This Visit   None Visit Diagnoses     Angioedema, subsequent encounter    -  Primary   Relevant Medications   famotidine (PEPCID) 20 MG tablet   predniSONE (STERAPRED UNI-PAK 21 TAB) 10 MG (21) TBPK tablet   Other Relevant Orders   Ambulatory referral to Allergy   Poison ivy       Relevant Medications   famotidine (PEPCID) 20 MG tablet   predniSONE (STERAPRED UNI-PAK 21 TAB) 10 MG (21) TBPK tablet   Other Relevant Orders   Ambulatory referral to Allergy        Follow up plan: Return if symptoms worsen or fail to improve.

## 2023-07-09 DIAGNOSIS — Z79899 Other long term (current) drug therapy: Secondary | ICD-10-CM | POA: Diagnosis not present

## 2023-07-17 ENCOUNTER — Ambulatory Visit (INDEPENDENT_AMBULATORY_CARE_PROVIDER_SITE_OTHER): Payer: Medicare Other

## 2023-07-17 VITALS — Ht 64.0 in | Wt 209.0 lb

## 2023-07-17 DIAGNOSIS — Z0000 Encounter for general adult medical examination without abnormal findings: Secondary | ICD-10-CM

## 2023-07-17 NOTE — Patient Instructions (Signed)
Brittany Miles , Thank you for taking time to come for your Medicare Wellness Visit. I appreciate your ongoing commitment to your health goals. Please review the following plan we discussed and let me know if I can assist you in the future.   Referrals/Orders/Follow-Ups/Clinician Recommendations: none  This is a list of the screening recommended for you and due dates:  Health Maintenance  Topic Date Due   COVID-19 Vaccine (3 - Pfizer risk series) 02/08/2021   Pap Smear  11/03/2022   Flu Shot  06/04/2023   Zoster (Shingles) Vaccine (1 of 2) 08/14/2023*   Medicare Annual Wellness Visit  07/16/2024   Mammogram  05/27/2025   Colon Cancer Screening  09/21/2028   Hepatitis C Screening  Completed   HIV Screening  Completed   HPV Vaccine  Aged Out   DTaP/Tdap/Td vaccine  Discontinued  *Topic was postponed. The date shown is not the original due date.    Advanced directives: (Declined) Advance directive discussed with you today. Even though you declined this today, please call our office should you change your mind, and we can give you the proper paperwork for you to fill out.  Next Medicare Annual Wellness Visit scheduled for next year: Yes 07/23/23 @ 9:35am telephone

## 2023-07-17 NOTE — Progress Notes (Signed)
Subjective:   Brittany Miles is a 62 y.o. female who presents for an Initial Medicare Annual Wellness Visit.  Visit Complete: Virtual  I connected with  Oren Section on 07/17/23 by a audio enabled telemedicine application and verified that I am speaking with the correct person using two identifiers.  Patient Location: Home  Provider Location: Office/Clinic  I discussed the limitations of evaluation and management by telemedicine. The patient expressed understanding and agreed to proceed.  Vital Signs: Unable to obtain new vitals due to this being a telehealth visit.  Patient Medicare AWV questionnaire was completed by the patient on (not done); I have confirmed that all information answered by patient is correct and no changes since this date. Cardiac Risk Factors include: obesity (BMI >30kg/m2)    Objective:    Today's Vitals   07/17/23 0919  Weight: 209 lb (94.8 kg)  Height: 5\' 4"  (1.626 m)   Body mass index is 35.87 kg/m.     07/17/2023    9:26 AM 11/07/2021   10:39 AM 01/27/2019   12:35 PM 09/21/2018   12:45 PM 09/30/2015   10:29 AM 09/28/2015   11:19 AM  Advanced Directives  Does Patient Have a Medical Advance Directive? No No No No No No  Would patient like information on creating a medical advance directive?  No - Patient declined No - Patient declined No - Patient declined No - patient declined information Yes - Educational materials given    Current Medications (verified) Outpatient Encounter Medications as of 07/17/2023  Medication Sig   carbamazepine (TEGRETOL) 200 MG tablet Take 200 mg by mouth 3 (three) times daily. 200 in am 150 at night   cetirizine (ZYRTEC ALLERGY) 10 MG tablet Take 1 tablet (10 mg total) by mouth daily.   famotidine (PEPCID) 20 MG tablet Take 1 tablet (20 mg total) by mouth 2 (two) times daily.   gabapentin (NEURONTIN) 800 MG tablet Take 800 mg by mouth 3 (three) times daily.   predniSONE (STERAPRED UNI-PAK 21 TAB) 10 MG (21) TBPK  tablet Take as directed on package.  (60 mg po on day 1, 50 mg po on day 2...)   No facility-administered encounter medications on file as of 07/17/2023.    Allergies (verified) Codeine and Penicillins   History: Past Medical History:  Diagnosis Date   FH: cerebral palsy    Seizures (HCC)    Past Surgical History:  Procedure Laterality Date   ANKLE SURGERY     as child   COLONOSCOPY WITH PROPOFOL N/A 09/21/2018   Procedure: COLONOSCOPY WITH PROPOFOL;  Surgeon: Toney Reil, MD;  Location: [facility] ENDOSCOPY;  Service: Gastroenterology;  Laterality: N/A;   dog bite     Family History  Problem Relation Age of Onset   Aneurysm Mother    Cancer Father    Breast cancer Neg Hx    Social History   Socioeconomic History   Marital status: Widowed    Spouse name: Not on file   Number of children: Not on file   Years of education: Not on file   Highest education level: Not on file  Occupational History   Not on file  Tobacco Use   Smoking status: Never   Smokeless tobacco: Never  Vaping Use   Vaping status: Never Used  Substance and Sexual Activity   Alcohol use: No   Drug use: Never   Sexual activity: Yes  Other Topics Concern   Not on file  Social History Narrative  Not on file   Social Determinants of Health   Financial Resource Strain: Low Risk  (07/17/2023)   Overall Financial Resource Strain (CARDIA)    Difficulty of Paying Living Expenses: Not hard at all  Food Insecurity: No Food Insecurity (07/17/2023)   Hunger Vital Sign    Worried About Running Out of Food in the Last Year: Never true    Ran Out of Food in the Last Year: Never true  Transportation Needs: No Transportation Needs (07/17/2023)   PRAPARE - Administrator, Civil Service (Medical): No    Lack of Transportation (Non-Medical): No  Physical Activity: Insufficiently Active (07/17/2023)   Exercise Vital Sign    Days of Exercise per Week: 4 days    Minutes of Exercise per Session: 20  min  Stress: No Stress Concern Present (07/17/2023)   [employer] of Occupational Health - Occupational Stress Questionnaire    Feeling of Stress : Not at all  Social Connections: Unknown (07/17/2023)   Social Connection and Isolation Panel [NHANES]    Frequency of Communication with Friends and Family: More than three times a week    Frequency of Social Gatherings with Friends and Family: More than three times a week    Attends Religious Services: Not on file    Active Member of Clubs or Organizations: No    Attends Banker Meetings: Never    Marital Status: Widowed    Tobacco Counseling Counseling given: Not Answered  Clinical Intake:  Pre-visit preparation completed: Yes  Pain : No/denies pain   BMI - recorded: 35.87 Nutritional Status: BMI > 30  Obese Nutritional Risks: None Diabetes: No     Interpreter Needed?: No  Comments: lives w/sister and brother Information entered by :: B.Mickey Hebel,LPN   Activities of Daily Living    07/17/2023    9:26 AM 06/29/2023   11:24 AM  In your present state of health, do you have any difficulty performing the following activities:  Hearing? 0 0  Vision? 0 0  Difficulty concentrating or making decisions? 1 0  Walking or climbing stairs? 1 1  Dressing or bathing? 0 0  Doing errands, shopping? 0 0  Preparing Food and eating ? N   Using the Toilet? N   In the past six months, have you accidently leaked urine? N   Do you have problems with loss of bowel control? N   Managing your Medications? N   Managing your Finances? N   Housekeeping or managing your Housekeeping? N     Patient Care Team: Margarita Mail, DO as PCP - General (Internal Medicine)  Indicate any recent Medical Services you may have received from other than [facility] providers in the past year (date may be approximate).     Assessment:   This is a routine wellness examination for [place].  Hearing/Vision screen Hearing Screening - Comments::  Adequate hearing Vision Screening - Comments:: Adequate vision w/glasses [facility]   Goals Addressed   None    Depression Screen    07/17/2023    9:25 AM 06/29/2023   11:24 AM 06/26/2023   11:32 AM 05/14/2023   10:00 AM 11/07/2021   10:40 AM 07/05/2020   10:25 AM  PHQ 2/9 Scores  PHQ - 2 Score 0 0 0 0 0 0  PHQ- 9 Score   0 0      Fall Risk    07/17/2023    9:21 AM 06/29/2023   11:24 AM 06/26/2023   11:32 AM 05/14/2023  10:00 AM 11/07/2021   10:39 AM  Fall Risk   Falls in the past year? 1 1 0 1 1  Comment pt has CP      Number falls in past yr: 0 1 0 1 1  Injury with Fall? 1 1 0 0 0  Risk for fall due to : No Fall Risks Impaired mobility;Impaired balance/gait;History of fall(s)   History of fall(s)  Follow up Education provided;Falls prevention discussed    Falls evaluation completed    MEDICARE RISK AT HOME: Medicare Risk at Home Any stairs in or around the home?: Yes If so, are there any without handrails?: Yes Home free of loose throw rugs in walkways, pet beds, electrical cords, etc?: Yes Adequate lighting in your home to reduce risk of falls?: Yes Life alert?: No Use of a cane, walker or w/c?: No Grab bars in the bathroom?: Yes Shower chair or bench in shower?: No Elevated toilet seat or a handicapped toilet?: No  TIMED UP AND GO:  Was the test performed? No    Cognitive Function:    11/07/2021   10:41 AM  MMSE - Mini Mental State Exam  Not completed: Unable to complete        07/17/2023    9:27 AM 11/07/2021   10:41 AM  6CIT Screen  What Year? 0 points 0 points  What month? 0 points 0 points  What time? 0 points 0 points  Count back from 20 0 points 0 points  Months in reverse 4 points 0 points  Repeat phrase 0 points 0 points  Total Score 4 points 0 points    Immunizations Immunization History  Administered Date(s) Administered   PFIZER(Purple Top)SARS-COV-2 Vaccination 12/21/2020, 01/11/2021    TDAP status: Due, Education has been provided  regarding the importance of this vaccine. Advised may receive this vaccine at local pharmacy or Health Dept. Aware to provide a copy of the vaccination record if obtained from local pharmacy or Health Dept. Verbalized acceptance and understanding.  Flu Vaccine status: Declined, Education has been provided regarding the importance of this vaccine but patient still declined. Advised may receive this vaccine at local pharmacy or Health Dept. Aware to provide a copy of the vaccination record if obtained from local pharmacy or Health Dept. Verbalized acceptance and understanding.  Pneumococcal vaccine status: Declined,  Education has been provided regarding the importance of this vaccine but patient still declined. Advised may receive this vaccine at local pharmacy or Health Dept. Aware to provide a copy of the vaccination record if obtained from local pharmacy or Health Dept. Verbalized acceptance and understanding.   Covid-19 vaccine status: Completed vaccines  Qualifies for Shingles Vaccine? Yes   Zostavax completed No   Shingrix Completed?: No.    Education has been provided regarding the importance of this vaccine. Patient has been advised to call insurance company to determine out of pocket expense if they have not yet received this vaccine. Advised may also receive vaccine at local pharmacy or Health Dept. Verbalized acceptance and understanding.  Screening Tests Health Maintenance  Topic Date Due   COVID-19 Vaccine (3 - Pfizer risk series) 02/08/2021   PAP SMEAR-Modifier  11/03/2022   INFLUENZA VACCINE  06/04/2023   Zoster Vaccines- Shingrix (1 of 2) 08/14/2023 (Originally 02/21/1980)   Medicare Annual Wellness (AWV)  07/16/2024   MAMMOGRAM  05/27/2025   Colonoscopy  09/21/2028   Hepatitis C Screening  Completed   HIV Screening  Completed   HPV VACCINES  Aged Out  DTaP/Tdap/Td  Discontinued    Health Maintenance  Health Maintenance Due  Topic Date Due   COVID-19 Vaccine (3 - Pfizer  risk series) 02/08/2021   PAP SMEAR-Modifier  11/03/2022   INFLUENZA VACCINE  06/04/2023    Colorectal cancer screening: Type of screening: Colonoscopy. Completed yes. Repeat every 10 years  Mammogram status: Completed yes. Repeat every year  Lung Cancer Screening: (Low Dose CT Chest recommended if Age 50-80 years, 20 pack-year currently smoking OR have quit w/in 15years.) does not qualify.   Lung Cancer Screening Referral: no  Additional Screening:  Hepatitis C Screening: does not qualify; Completed yes  Vision Screening: Recommended annual ophthalmology exams for early detection of glaucoma and other disorders of the eye. Is the patient up to date with their annual eye exam?  Yes  Who is the provider or what is the name of the office in which the patient attends annual eye exams? [facility] If pt is not established with a provider, would they like to be referred to a provider to establish care? No .   Dental Screening: Recommended annual dental exams for proper oral hygiene  Diabetic Foot Exam: n/a  Community Resource Referral / Chronic Care Management: CRR required this visit?  No   CCM required this visit?  No    Plan:     I have personally reviewed and noted the following in the patient's chart:   Medical and social history Use of alcohol, tobacco or illicit drugs  Current medications and supplements including opioid prescriptions. Patient is not currently taking opioid prescriptions. Functional ability and status Nutritional status Physical activity Advanced directives List of other physicians Hospitalizations, surgeries, and ER visits in previous 12 months Vitals Screenings to include cognitive, depression, and falls Referrals and appointments  In addition, I have reviewed and discussed with patient certain preventive protocols, quality metrics, and best practice recommendations. A written personalized care plan for preventive services as well as general  preventive health recommendations were provided to patient.    Sue Lush, LPN   1/61/0960   After Visit Summary: (MyChart) Due to this being a telephonic visit, the after visit summary with patients personalized plan was offered to patient via MyChart   Nurse Notes: The patient states she is are doing alright.  *Pt inquires about her cholesterol levels: she relays she did not get the results.

## 2023-08-27 DIAGNOSIS — Z79899 Other long term (current) drug therapy: Secondary | ICD-10-CM | POA: Diagnosis not present

## 2023-09-01 DIAGNOSIS — J300 Vasomotor rhinitis: Secondary | ICD-10-CM | POA: Diagnosis not present

## 2023-09-01 DIAGNOSIS — R21 Rash and other nonspecific skin eruption: Secondary | ICD-10-CM | POA: Diagnosis not present

## 2023-09-01 DIAGNOSIS — J3089 Other allergic rhinitis: Secondary | ICD-10-CM | POA: Diagnosis not present

## 2024-01-06 DIAGNOSIS — Z79899 Other long term (current) drug therapy: Secondary | ICD-10-CM | POA: Diagnosis not present

## 2024-04-15 ENCOUNTER — Telehealth: Payer: Self-pay | Admitting: Emergency Medicine

## 2024-04-15 NOTE — Telephone Encounter (Signed)
-----   Message from Rockney Cid sent at 04/15/2024  1:38 PM EDT ----- Regarding: Check In Please call patient this afternoon to see how she is doing. Patient's dentist called and said the patient had a seizure after local anesthetic earlier today. Please remind patient to call her Neurologist to have medications adjusted. I will see her for our appointment next month.

## 2024-04-15 NOTE — Telephone Encounter (Signed)
 Spoke to patient and she stated she feels much better, she is taking advil for headaches and will reach out to Neurology

## 2024-05-16 ENCOUNTER — Ambulatory Visit (INDEPENDENT_AMBULATORY_CARE_PROVIDER_SITE_OTHER): Payer: Self-pay | Admitting: Internal Medicine

## 2024-05-16 ENCOUNTER — Other Ambulatory Visit: Payer: Self-pay

## 2024-05-16 ENCOUNTER — Encounter: Payer: Self-pay | Admitting: Internal Medicine

## 2024-05-16 VITALS — BP 118/72 | HR 81 | Temp 97.9°F | Resp 16 | Ht 62.0 in | Wt 197.2 lb

## 2024-05-16 DIAGNOSIS — Z1231 Encounter for screening mammogram for malignant neoplasm of breast: Secondary | ICD-10-CM

## 2024-05-16 DIAGNOSIS — Z9109 Other allergy status, other than to drugs and biological substances: Secondary | ICD-10-CM

## 2024-05-16 DIAGNOSIS — G808 Other cerebral palsy: Secondary | ICD-10-CM | POA: Diagnosis not present

## 2024-05-16 DIAGNOSIS — M25571 Pain in right ankle and joints of right foot: Secondary | ICD-10-CM

## 2024-05-16 DIAGNOSIS — E782 Mixed hyperlipidemia: Secondary | ICD-10-CM

## 2024-05-16 MED ORDER — CETIRIZINE HCL 10 MG PO TABS: 10.0000 mg | ORAL_TABLET | Freq: Every day | ORAL | 3 refills | Status: AC

## 2024-05-16 MED ORDER — ATORVASTATIN CALCIUM 10 MG PO TABS: 10.0000 mg | ORAL_TABLET | Freq: Every day | ORAL | 3 refills | Status: DC

## 2024-05-16 NOTE — Patient Instructions (Signed)
 GERD in Adults: Diet Changes When you have gastroesophageal reflux disease (GERD), you may need to make changes to your diet. Choosing the right foods can help with your symptoms. Think about working with an expert in healthy eating called a dietitian. They can help you make healthy food choices. What are tips for following this plan? Reading food labels Look for foods that are low in saturated fat. Foods that may help with your symptoms include: Foods with less than 5% of daily value (DV) of fat. Foods with 0 grams of trans fat. Cooking [employer] in ways that don't use a lot of fat. These ways include: Baking. Steaming. Grilling. Broiling. To add flavor, try to use herbs that are low in spice and acidity. Avoid frying your food. Meal planning  Eat small meals often rather than eating 3 large meals each day. Eat your meals slowly in a place where you feel relaxed. If told by your health care provider, avoid: Foods that cause symptoms. Keep a food diary to keep track of foods that cause symptoms. Alcohol. Drinking a lot of liquid with meals. General instructions For 2-3 hours after you eat, avoid: Bending over. Exercise. Lying down. Chew sugar-free gum after meals. What foods should I eat? Eat a healthy diet. Try to include: Foods with high amounts of fiber. These include: Fruits and vegetables. Whole grains and beans. Low-fat dairy products. Lean meats, fish, and poultry. Egg whites. Foods that cause symptoms in someone else may not cause symptoms for you. Work with your provider to find foods that are safe for you. The items listed above may not be all the foods and drinks you can have. Talk with a dietitian to learn more. The items listed above may not be a complete list of foods and beverages you can eat and drink. Contact a dietitian for more information. What foods should I avoid? Limiting some of these foods may help with your symptoms. Each person is different.  Talk with a dietitian or your provider to help you find the exact foods to avoid. Some of the foods to avoid may include: Fruits Fruits with a lot of acid in them. These may include citrus fruits, such as oranges, grapefruit, pineapple, and lemons. Vegetables Deep-fried vegetables, such as [place] fries. Vegetables, sauces, or toppings made with added fat and vegetables with acid in them. These may include tomatoes and tomato products, chili peppers, onions, garlic, and horseradish. Grains Pastries or quick breads with added fat. Meats and other proteins High-fat meats, such as fatty beef or pork, hot dogs, ribs, ham, sausage, salami, and bacon. Fried meat or protein, such as fried fish and fried chicken. Egg yolks. Fats and oils Butter. Margarine. Shortening. Ghee. Drinks Coffee and other drinks with caffeine in them. Fizzy and sugary drinks, such as soda and energy drinks. Fruit juice made with acidic fruits, such as orange or grapefruit. Tomato juice. Sweets and desserts Chocolate and cocoa. Donuts. Seasonings and condiments Mint, such as peppermint and spearmint. Condiments, herbs, or seasonings that cause symptoms. These may include curry, hot sauce, or vinegar-based salad dressings. The items listed above may not be all the foods and drinks you should avoid. Talk with a dietitian to learn more. Questions to ask your health care provider Changes to your diet and everyday life are often the first steps taken to manage symptoms of GERD. If these changes don't help, talk with your provider about taking medicines. Where to find more information [employer] for G[employer]intestinal [employer]:  aboutgerd.org This information is not intended to replace advice given to you by your health care provider. Make sure you discuss any questions you have with your health care provider. Document Revised: 09/01/2023 Document Reviewed: 03/18/2023 Elsevier Patient Education  2024 [employer].

## 2024-05-16 NOTE — Progress Notes (Signed)
 Established Patient Office Visit  Subjective    Patient ID: Brittany Miles, female    DOB: [birth year]  Age: 63 y.o. MRN: [id number]  CC:  Chief Complaint  Patient presents with   Medical Management of Chronic Issues    1 year recheck    HPI Brittany Miles presents to follow up on chronic medical conditions. Unfortunately her sister in law recently passed away and her brother in law had a stroke 3 weeks ago and she is his primary caregiver.   Discussed the use of AI scribe software for clinical note transcription with the patient, who gave verbal consent to proceed.  History of Present Illness Brittany Miles is a 63 year old female who presents for a routine follow-up and medication review.  She requires a refill of Zyrtec  for allergies, as she spends considerable time outdoors and wishes to prevent symptoms. She no longer needs Pepcid  for acid reflux due to the absence of significant symptoms. She is on Tegretol  200 mg in the morning and 150 mg in the evening, and gabapentin 800 mg three times daily for seizure management, with no recent seizures reported. She inquires about potential interactions between grapefruit juice and her medications, particularly atorvastatin  and Tegretol . Her cholesterol has been elevated in the past, and she has previously used atorvastatin . She has cerebral palsy affecting one side and wears a leg brace. Recently, she experiences pain and pinching in her ankle, with increased foot inversion. Her current brace has been in use since 2013.   CP/Seizure Disorder: -Following with Dr. Lane, Neurology -No seizure activity recently -Is currently on Gabapentin 800 mg TID and Tegretol  200 mg in the am, 150 mg in the evening  -Has a brace on her right leg, has had it since 2013 and now having some right ankle pain  Vitamin B12 Deficiency:  -Currently on B complex vitamins r  Vitamin D Deficiency: -Currently on 5000 international units   HLD: -Medications:  Nothing currently but had been on atorvastatin  in the past which she tolerated well. -Last lipid panel: Lipid Panel     Component Value Date/Time   CHOL 236 (H) 05/14/2023 1026   TRIG 60 05/14/2023 1026   HDL 80 05/14/2023 1026   CHOLHDL 3.0 05/14/2023 1026   VLDL 7 10/22/2016 1240   LDLCALC 141 (H) 05/14/2023 1026   The 10-year ASCVD risk score (Arnett DK, et al., 2019) is: 3.1%   Values used to calculate the score:     Age: 20 years     Clincally relevant sex: Female     Is Non-Hispanic African American: No     Diabetic: No     Tobacco smoker: No     Systolic Blood Pressure: 118 mmHg     Is BP treated: No     HDL Cholesterol: 80 mg/dL     Total Cholesterol: 236 mg/dL  Health Maintenance: -Blood work UTD -Mammogram 7/24, due soon will order -Colonoscopy 09/2018, repeat in 10 years    Outpatient Encounter Medications as of 05/16/2024  Medication Sig   carbamazepine  (TEGRETOL ) 200 MG tablet Take 200 mg by mouth 3 (three) times daily. 200 in am 150 at night   cetirizine  (ZYRTEC  ALLERGY) 10 MG tablet Take 1 tablet (10 mg total) by mouth daily.   famotidine  (PEPCID ) 20 MG tablet Take 1 tablet (20 mg total) by mouth 2 (two) times daily.   gabapentin (NEURONTIN) 800 MG tablet Take 800 mg by mouth 3 (three) times daily.   predniSONE  (STERAPRED  UNI-PAK 21 TAB) 10 MG (21) TBPK tablet Take as directed on package.  (60 mg po on day 1, 50 mg po on day 2...) (Patient not taking: Reported on 05/16/2024)   No facility-administered encounter medications on file as of 05/16/2024.    Past Medical History:  Diagnosis Date   FH: cerebral palsy    Seizures (HCC)     Past Surgical History:  Procedure Laterality Date   ANKLE SURGERY     as child   COLONOSCOPY WITH PROPOFOL  N/A 09/21/2018   Procedure: COLONOSCOPY WITH PROPOFOL ;  Surgeon: Unk Corinn Skiff, MD;  Location: [facility] ENDOSCOPY;  Service: Gastroenterology;  Laterality: N/A;   dog bite      Family History  Problem Relation Age  of Onset   Aneurysm Mother    Cancer Father    Breast cancer Neg Hx     Social History   Socioeconomic History   Marital status: Widowed    Spouse name: Not on file   Number of children: Not on file   Years of education: Not on file   Highest education level: Not on file  Occupational History   Not on file  Tobacco Use   Smoking status: Never   Smokeless tobacco: Never  Vaping Use   Vaping status: Never Used  Substance and Sexual Activity   Alcohol use: No   Drug use: Never   Sexual activity: Yes  Other Topics Concern   Not on file  Social History Narrative   Not on file   Social Drivers of Health   Financial Resource Strain: Low Risk  (07/17/2023)   Overall Financial Resource Strain (CARDIA)    Difficulty of Paying Living Expenses: Not hard at all  Food Insecurity: No Food Insecurity (07/17/2023)   Hunger Vital Sign    Worried About Running Out of Food in the Last Year: Never true    Ran Out of Food in the Last Year: Never true  Transportation Needs: No Transportation Needs (07/17/2023)   PRAPARE - Administrator, Civil Service (Medical): No    Lack of Transportation (Non-Medical): No  Physical Activity: Insufficiently Active (07/17/2023)   Exercise Vital Sign    Days of Exercise per Week: 4 days    Minutes of Exercise per Session: 20 min  Stress: No Stress Concern Present (07/17/2023)   [employer] of Occupational Health - Occupational Stress Questionnaire    Feeling of Stress : Not at all  Social Connections: Unknown (07/17/2023)   Social Connection and Isolation Panel    Frequency of Communication with Friends and Family: More than three times a week    Frequency of Social Gatherings with Friends and Family: More than three times a week    Attends Religious Services: Not on file    Active Member of Clubs or Organizations: No    Attends Banker Meetings: Never    Marital Status: Widowed  Intimate Partner Violence: Not At Risk  (07/17/2023)   Humiliation, Afraid, Rape, and Kick questionnaire    Fear of Current or Ex-Partner: No    Emotionally Abused: No    Physically Abused: No    Sexually Abused: No    Review of Systems  Neurological:  Negative for seizures.  All other systems reviewed and are negative.       Objective    There were no vitals taken for this visit.  Physical Exam Constitutional:      Appearance: Normal appearance.  HENT:  Head: Normocephalic and atraumatic.     Mouth/Throat:     Mouth: Mucous membranes are moist.     Pharynx: Oropharynx is clear.  Eyes:     Extraocular Movements: Extraocular movements intact.     Conjunctiva/sclera: Conjunctivae normal.     Pupils: Pupils are equal, round, and reactive to light.  Neck:     Comments: No thyromegaly Cardiovascular:     Rate and Rhythm: Normal rate and regular rhythm.  Pulmonary:     Effort: Pulmonary effort is normal.     Breath sounds: Normal breath sounds.  Musculoskeletal:     Cervical back: No tenderness.     Right lower leg: No edema.     Left lower leg: No edema.     Comments: Has brace on lower right extremity  Lymphadenopathy:     Cervical: No cervical adenopathy.  Skin:    General: Skin is warm and dry.  Neurological:     General: No focal deficit present.     Mental Status: She is alert. Mental status is at baseline.  Psychiatric:        Mood and Affect: Mood normal.        Behavior: Behavior normal.     Last CBC Lab Results  Component Value Date   WBC 4.0 07/05/2020   HGB 12.4 07/05/2020   HCT 37.1 07/05/2020   MCV 86.7 07/05/2020   MCH 29.0 07/05/2020   RDW 12.8 07/05/2020   PLT 259 07/05/2020   Last metabolic panel Lab Results  Component Value Date   GLUCOSE 86 07/05/2020   NA 138 10/05/2020   K 4.2 10/05/2020   CL 97 (L) 10/05/2020   CO2 27 10/05/2020   BUN 8 07/05/2020   CREATININE 0.68 07/05/2020   GFRNONAA 96 07/05/2020   CALCIUM  9.0 07/05/2020   PROT 7.1 07/05/2020   BILITOT  0.3 07/05/2020   AST 22 07/05/2020   ALT 11 07/05/2020   ANIONGAP 8 10/22/2016   Last lipids Lab Results  Component Value Date   CHOL 236 (H) 05/14/2023   HDL 80 05/14/2023   LDLCALC 141 (H) 05/14/2023   TRIG 60 05/14/2023   CHOLHDL 3.0 05/14/2023   Last hemoglobin A1c No results found for: HGBA1C Last thyroid  functions Lab Results  Component Value Date   TSH 2.60 07/05/2020   Last vitamin D No results found for: 25OHVITD2, 25OHVITD3, VD25OH Last vitamin B12 and Folate No results found for: VITAMINB12, FOLATE      Assessment & Plan:   Assessment & Plan Cerebral Palsy with Ankle Pain Ankle pain and increased foot inversion likely due to outdated brace. - Refer to [facility] for evaluation and potential new brace.  Seizure Disorder Seizures controlled on current Tegretol  and gabapentin regimen. Prescription discrepancy noted. - Continue current Tegretol  and gabapentin regimen. - Monitor for any seizure activity.  Hyperlipidemia Cholesterol levels increased, cardiovascular risk borderline. Atorvastatin  restart recommended. Advised against grapefruit juice due to drug interactions. - Prescribe atorvastatin  10 mg. - Advise against grapefruit juice consumption.  Gastroesophageal Reflux Disease (GERD) Symptoms decreased, no regular medication needed. Discussed dietary triggers and management. - Provide a list of foods that may trigger acid reflux. - Advise on dietary modifications to avoid triggers. - Recommend over-the-counter Pepcid  if symptoms recur.  Allergic Rhinitis Requires Zyrtec  refill for allergy prevention. - Refill Zyrtec  prescription.  - atorvastatin  (LIPITOR) 10 MG tablet; Take 1 tablet (10 mg total) by mouth daily.  Dispense: 90 tablet; Refill: 3 - AMB referral to  orthopedics - cetirizine  (ZYRTEC  ALLERGY) 10 MG tablet; Take 1 tablet (10 mg total) by mouth daily.  Dispense: 90 tablet; Refill: 3 - MM 3D SCREENING MAMMOGRAM BILATERAL  BREAST; Future    Return in about 1 year (around 05/16/2025).   Sharyle Fischer, DO

## 2024-05-23 ENCOUNTER — Ambulatory Visit: Admitting: Orthopedic Surgery

## 2024-05-23 ENCOUNTER — Encounter: Payer: Self-pay | Admitting: Orthopedic Surgery

## 2024-05-23 VITALS — BP 122/80 | Ht 63.0 in | Wt 197.0 lb

## 2024-05-23 DIAGNOSIS — M21371 Foot drop, right foot: Secondary | ICD-10-CM

## 2024-05-23 NOTE — Patient Instructions (Signed)
 We will send a referral for you to obtain a new AFO for the right foot  If you have not heard from us  within a few days, please contact the clinic.

## 2024-05-23 NOTE — Progress Notes (Signed)
 New Patient Visit  Assessment: Brittany Miles is a 63 y.o. female with the following: Right ankle and foot pain  Plan: Yurani Fettes has CP affecting the right side of her body.  She has been wearing an AFO for many years.  Her current AFO was fabricated in 2013.  She moved to [place] in 2016.  She notes some worsening pain recently, primarily in the anterior aspect of the ankle.  She thinks that her brace is worn out.  She is interested in a new brace.  We will place a referral to have this fabricated.  Will send the referral to the clinic in [place].  If she has not heard from anybody within a couple days, she will contact the clinic.  Follow-up: Return if symptoms worsen or fail to improve.  Subjective:  Chief Complaint  Patient presents with   Ankle Pain    Pain in the right ankle     History of Present Illness: Brittany Miles is a 63 y.o. female who has been referred by Sharyle Fischer, DO for evaluation of right ankle pain.  She has a history of cerebral palsy, affecting her right side.  This includes both upper and lower extremity.  She has an extensive history of surgery on the right foot and ankle to maintain the position of her foot.  She has been wearing AFOs for years.  Her current AFO was made in 2013.  This was completed in [place].  She has since moved to [place], in 2016.  She notes progressively worsening pain in the anterior aspect of the right ankle.  Without the brace, she notes that the ankle will invert.  She is interested in new braces, and would like to avoid any additional procedures at this time.  She has not had surgery on the foot or the ankle in many years.  She has never had Botox injections.   Review of Systems: No fevers or chills No numbness or tingling No chest pain No shortness of breath No bowel or bladder dysfunction No GI distress No headaches   Medical History:  Past Medical History:  Diagnosis Date   FH: cerebral palsy     Seizures (HCC)     Past Surgical History:  Procedure Laterality Date   ANKLE SURGERY     as child   COLONOSCOPY WITH PROPOFOL  N/A 09/21/2018   Procedure: COLONOSCOPY WITH PROPOFOL ;  Surgeon: Unk Corinn Skiff, MD;  Location: [facility] ENDOSCOPY;  Service: Gastroenterology;  Laterality: N/A;   dog bite      Family History  Problem Relation Age of Onset   Aneurysm Mother    Cancer Father    Breast cancer Neg Hx    Social History   Tobacco Use   Smoking status: Never   Smokeless tobacco: Never  Vaping Use   Vaping status: Never Used  Substance Use Topics   Alcohol use: No   Drug use: Never    Allergies  Allergen Reactions   Codeine Nausea And Vomiting and Rash   Penicillins Nausea And Vomiting and Rash    Current Meds  Medication Sig   atorvastatin  (LIPITOR) 10 MG tablet Take 1 tablet (10 mg total) by mouth daily.   carbamazepine  (TEGRETOL ) 200 MG tablet Take 200 mg by mouth 3 (three) times daily. 200 in am 150 at night   cetirizine  (ZYRTEC  ALLERGY) 10 MG tablet Take 1 tablet (10 mg total) by mouth daily.   famotidine  (PEPCID ) 20 MG tablet Take 1 tablet (20 mg total)  by mouth 2 (two) times daily.   gabapentin (NEURONTIN) 800 MG tablet Take 800 mg by mouth 3 (three) times daily.    Objective: BP 122/80   Ht 5' 3 (1.6 m)   Wt 197 lb (89.4 kg)   BMI 34.90 kg/m   Physical Exam:  General: Alert and oriented. and No acute distress. Gait: Right sided antalgic gait.  AFO in the right ankle  Right foot and ankle with multiple surgical incisions.  These are all well-healed.  Limited dorsiflexion of the TA and EHL.  Toes warm and well-perfused.  After removal of the brace, there is progressive inversion of the ankle.  Ankle is otherwise stiff.  Limited dorsiflexion beyond neutral.  There is no skin breakdown or lesions.  IMAGING: No new imaging obtained today   New Medications:  No orders of the defined types were placed in this encounter.     Oneil DELENA Horde,  MD  05/23/2024 2:55 PM

## 2024-06-07 ENCOUNTER — Telehealth: Payer: Self-pay | Admitting: Orthopedic Surgery

## 2024-06-07 NOTE — Telephone Encounter (Signed)
 Patient called and said that he was suppose to send over a referral two weeks for the [facility] on [address] and nobody ever got it. CB#[phone]

## 2024-06-09 ENCOUNTER — Telehealth: Payer: Self-pay

## 2024-06-09 DIAGNOSIS — M21371 Foot drop, right foot: Secondary | ICD-10-CM

## 2024-06-09 NOTE — Telephone Encounter (Signed)
 Called patient and advised her that the order is placed

## 2024-06-09 NOTE — Telephone Encounter (Signed)
 Order is is for the patient

## 2024-06-09 NOTE — Telephone Encounter (Signed)
 Spoke to patient made her aware that the order is in

## 2024-06-17 ENCOUNTER — Ambulatory Visit (INDEPENDENT_AMBULATORY_CARE_PROVIDER_SITE_OTHER)

## 2024-06-17 ENCOUNTER — Ambulatory Visit: Payer: No Typology Code available for payment source | Admitting: Podiatry

## 2024-06-17 ENCOUNTER — Encounter: Payer: Self-pay | Admitting: Podiatry

## 2024-06-17 VITALS — Ht 63.0 in | Wt 197.0 lb

## 2024-06-17 DIAGNOSIS — M21371 Foot drop, right foot: Secondary | ICD-10-CM

## 2024-06-17 NOTE — Progress Notes (Signed)
   Chief Complaint  Patient presents with   Foot Orthotics    Pt is here to see how see can get fitted for a new brace for the right foot/ankle due to foot drop, she states the brace that she is currently wearing she has had it since 2013 and it does not fit properly anymore.    HPI: 63 y.o. female presenting today as a new patient for above complaint.  History of cerebral palsy affecting the right sided cerebral palsy.  Past Medical History:  Diagnosis Date   FH: cerebral palsy    Seizures (HCC)     Past Surgical History:  Procedure Laterality Date   ANKLE SURGERY     as child   COLONOSCOPY WITH PROPOFOL  N/A 09/21/2018   Procedure: COLONOSCOPY WITH PROPOFOL ;  Surgeon: Unk Corinn Skiff, MD;  Location: [facility] END[facility];  Service: Gastroenterology;  Laterality: N/A;   dog bite      Allergies  Allergen Reactions   Codeine Nausea And Vomiting and Rash   Penicillins Nausea And Vomiting and Rash    Physical Exam: General: The patient is alert and oriented x3 in no acute distress.  Dermatology: Skin is warm, dry and supple bilateral lower extremities.   Vascular: Palpable pedal pulses bilaterally. Capillary refill within normal limits.  No appreciable edema.  No erythema.  Neurological: Grossly intact via light touch  Musculoskeletal Exam: Right sided weakness noted throughout the upper and lower extremity.  Atrophy noted to the right lower extremity compared to the contralateral limb.  Loss of peroneal muscle integrity creating reducible cavovarus deformity of the foot   Assessment/Plan of Care: 1.  Foot drop right lower extremity 2.  Right sided cerebral palsy  -Patient evaluated -Appointment with our Pedorthist for new molding of AFO custom bracing.  Medically necessary to prevent falls and facilitate the patient better with ambulation -After evaluation the patient was getting her shoes on and sustained a seizure-like episode.  Vitals were taken.  Patient was stable and  responsive throughout the episode.  Once stabilized patient's family was contacted and a family member came to pick the patient up and take her home due to concern for driving after this seizure episode.  Patient does report having episodes like this in the past although it has been a long time.  She believes it is stress/anxiety induced -Return to clinic for AFO custom fitting       Thresa EMERSON Sar, DPM [facility]  Dr. Thresa EMERSON Sar, DPM    [address] [place], [place] [address]                Office [phone]  Fax [phone]

## 2024-07-22 ENCOUNTER — Ambulatory Visit

## 2024-07-22 VITALS — BP 122/80 | Ht 63.0 in | Wt 201.0 lb

## 2024-07-22 DIAGNOSIS — Z0000 Encounter for general adult medical examination without abnormal findings: Secondary | ICD-10-CM | POA: Diagnosis not present

## 2024-07-22 NOTE — Progress Notes (Signed)
 Because this visit was a virtual/telehealth visit,  certain criteria was not obtained, such a blood pressure, CBG if applicable, and timed get up and go. Any medications not marked as taking were not mentioned during the medication reconciliation part of the visit. Any vitals not documented were not able to be obtained due to this being a telehealth visit or patient was unable to self-report a recent blood pressure reading due to a lack of equipment at home via telehealth. Vitals that have been documented are verbally provided by the patient.  This visit was performed by a medical professional under my direct supervision. I was immediately available for consultation/collaboration. I have reviewed and agree with the Annual Wellness Visit documentation.  Subjective:   Brittany Miles is a 63 y.o. who presents for a Medicare Wellness preventive visit.  As a reminder, Annual Wellness Visits don't include a physical exam, and some assessments may be limited, especially if this visit is performed virtually. We may recommend an in-person follow-up visit with your provider if needed.  Visit Complete: Virtual I connected with  Brittany Miles on 07/22/24 by a audio enabled telemedicine application and verified that I am speaking with the correct person using two identifiers.  Patient Location: Home  Provider Location: Home Office  I discussed the limitations of evaluation and management by telemedicine. The patient expressed understanding and agreed to proceed.  Vital Signs: Because this visit was a virtual/telehealth visit, some criteria may be missing or patient reported. Any vitals not documented were not able to be obtained and vitals that have been documented are patient reported.  VideoDeclined- This patient declined Librarian, academic. Therefore the visit was completed with audio only.  Persons Participating in Visit: Patient.  AWV Questionnaire: No: Patient Medicare  AWV questionnaire was not completed prior to this visit.  Cardiac Risk Factors include: advanced age (>52men, >69 women);dyslipidemia     Objective:    Today's Vitals   07/22/24 0939  BP: 122/80  Weight: 201 lb (91.2 kg)  Height: 5' 3 (1.6 m)   Body mass index is 35.61 kg/m.     07/22/2024    9:38 AM 07/17/2023    9:26 AM 11/07/2021   10:39 AM 01/27/2019   12:35 PM 09/21/2018   12:45 PM 09/30/2015   10:29 AM 09/28/2015   11:19 AM  Advanced Directives  Does Patient Have a Medical Advance Directive? No No No No  No  No  No   Would patient like information on creating a medical advance directive? No - Patient declined  No - Patient declined No - Patient declined  No - Patient declined  No - patient declined information  Yes - Educational materials given      Data saved with a previous flowsheet row definition    Current Medications (verified) Outpatient Encounter Medications as of 07/22/2024  Medication Sig   atorvastatin  (LIPITOR) 10 MG tablet Take 1 tablet (10 mg total) by mouth daily.   carbamazepine  (TEGRETOL ) 200 MG tablet Take 200 mg by mouth 3 (three) times daily. 200 in am 150 at night   cetirizine  (ZYRTEC  ALLERGY) 10 MG tablet Take 1 tablet (10 mg total) by mouth daily.   famotidine  (PEPCID ) 20 MG tablet Take 1 tablet (20 mg total) by mouth 2 (two) times daily.   gabapentin (NEURONTIN) 800 MG tablet Take 800 mg by mouth 3 (three) times daily.   No facility-administered encounter medications on file as of 07/22/2024.    Allergies (verified) Codeine and  Penicillins   History: Past Medical History:  Diagnosis Date   FH: cerebral palsy    Seizures (HCC)    Past Surgical History:  Procedure Laterality Date   ANKLE SURGERY     as child   COLONOSCOPY WITH PROPOFOL  N/A 09/21/2018   Procedure: COLONOSCOPY WITH PROPOFOL ;  Surgeon: Unk Corinn Skiff, MD;  Location: [facility] ENDOSCOPY;  Service: Gastroenterology;  Laterality: N/A;   dog bite     Family History  Problem  Relation Age of Onset   Aneurysm Mother    Cancer Father    Breast cancer Neg Hx    Social History   Socioeconomic History   Marital status: Widowed    Spouse name: Not on file   Number of children: Not on file   Years of education: Not on file   Highest education level: Not on file  Occupational History   Not on file  Tobacco Use   Smoking status: Never   Smokeless tobacco: Never  Vaping Use   Vaping status: Never Used  Substance and Sexual Activity   Alcohol use: No   Drug use: Never   Sexual activity: Yes  Other Topics Concern   Not on file  Social History Narrative   Not on file   Social Drivers of Health   Financial Resource Strain: Low Risk  (07/17/2023)   Overall Financial Resource Strain (CARDIA)    Difficulty of Paying Living Expenses: Not hard at all  Food Insecurity: No Food Insecurity (07/22/2024)   Hunger Vital Sign    Worried About Running Out of Food in the Last Year: Never true    Ran Out of Food in the Last Year: Never true  Transportation Needs: No Transportation Needs (07/22/2024)   PRAPARE - Administrator, Civil Service (Medical): No    Lack of Transportation (Non-Medical): No  Physical Activity: Insufficiently Active (07/22/2024)   Exercise Vital Sign    Days of Exercise per Week: 7 days    Minutes of Exercise per Session: 20 min  Stress: No Stress Concern Present (07/22/2024)   [employer] of Occupational Health - Occupational Stress Questionnaire    Feeling of Stress: Only a little  Social Connections: Moderately Isolated (07/22/2024)   Social Connection and Isolation Panel    Frequency of Communication with Friends and Family: More than three times a week    Frequency of Social Gatherings with Friends and Family: More than three times a week    Attends Religious Services: 1 to 4 times per year    Active Member of [employer] or Organizations: No    Attends Banker Meetings: Never    Marital Status: Widowed     Tobacco Counseling Counseling given: Not Answered    Clinical Intake:  Pre-visit preparation completed: Yes  Pain : No/denies pain     BMI - recorded: 35.61 Nutritional Status: BMI > 30  Obese Nutritional Risks: Unintentional weight gain Diabetes: No  No results found for: HGBA1C   How often do you need to have someone help you when you read instructions, pamphlets, or other written materials from your doctor or pharmacy?: 1 - Never  Interpreter Needed?: No  Information entered by :: Melroy Bougher,CMA   Activities of Daily Living     07/22/2024    9:41 AM 05/16/2024    8:19 AM  In your present state of health, do you have any difficulty performing the following activities:  Hearing? 0 0  Vision? 0 0  Difficulty  concentrating or making decisions? 0 0  Walking or climbing stairs? 0 0  Dressing or bathing? 0 0  Doing errands, shopping? 0 0  Preparing Food and eating ? N   Using the Toilet? N   In the past six months, have you accidently leaked urine? N   Do you have problems with loss of bowel control? N   Managing your Medications? N   Managing your Finances? N   Housekeeping or managing your Housekeeping? N     Patient Care Team: Bernardo Fend, DO as PCP - General (Internal Medicine)  I have updated your Care Teams any recent Medical Services you may have received from other providers in the past year.     Assessment:   This is a routine wellness examination for [place].  Hearing/Vision screen Hearing Screening - Comments:: Patient has no difficulties Vision Screening - Comments:: Patient wears glasses    Goals Addressed               This Visit's Progress     Patient Stated (pt-stated)        Patient would like to be healthier       Depression Screen     07/22/2024    9:42 AM 05/16/2024    8:19 AM 07/17/2023    9:25 AM 06/29/2023   11:24 AM 06/26/2023   11:32 AM 05/14/2023   10:00 AM 11/07/2021   10:40 AM  PHQ 2/9 Scores  PHQ  - 2 Score 0 0 0 0 0 0 0  PHQ- 9 Score 0    0 0     Fall Risk     07/22/2024    9:40 AM 05/16/2024    8:19 AM 07/17/2023    9:21 AM 06/29/2023   11:24 AM 06/26/2023   11:32 AM  Fall Risk   Falls in the past year? 0 0 1 1 0  Comment   pt has CP    Number falls in past yr: 0 0 0 1 0  Injury with Fall? 0 0 1 1 0  Risk for fall due to : No Fall Risks No Fall Risks No Fall Risks Impaired mobility;Impaired balance/gait;History of fall(s)   Follow up Falls evaluation completed Falls evaluation completed Education provided;Falls prevention discussed      MEDICARE RISK AT HOME:  Medicare Risk at Home Any stairs in or around the home?: Yes If so, are there any without handrails?: No Home free of loose throw rugs in walkways, pet beds, electrical cords, etc?: Yes Adequate lighting in your home to reduce risk of falls?: Yes Life alert?: No Use of a cane, walker or w/c?: No Grab bars in the bathroom?: No Shower chair or bench in shower?: No Elevated toilet seat or a handicapped toilet?: No  TIMED UP AND GO:  Was the test performed?  No  Cognitive Function: 6CIT completed    11/07/2021   10:41 AM  MMSE - Mini Mental State Exam  Not completed: Unable to complete        07/22/2024    9:43 AM 07/17/2023    9:27 AM 11/07/2021   10:41 AM  6CIT Screen  What Year? 0 points 0 points 0 points  What month? 0 points 0 points 0 points  What time? 0 points 0 points 0 points  Count back from 20 0 points 0 points 0 points  Months in reverse 0 points 4 points 0 points  Repeat phrase 0 points 0 points 0 points  Total Score 0 points 4 points 0 points    Immunizations Immunization History  Administered Date(s) Administered   PFIZER(Purple Top)SARS-COV-2 Vaccination 12/21/2020, 01/11/2021    Screening Tests Health Maintenance  Topic Date Due   Cervical Cancer Screening (HPV/Pap Cotest)  Never done   Pneumococcal Vaccine: 50+ Years (1 of 1 - PCV) Never done   COVID-19 Vaccine (3 - Pfizer risk  series) 02/08/2021   Zoster Vaccines- Shingrix (1 of 2) 08/16/2024 (Originally [birth year]/21/1981)   Influenza Vaccine  01/31/2025 (Originally 06/03/2024)   Mammogram  05/27/2025   Medicare Annual Wellness (AWV)  07/22/2025   Colonoscopy  09/21/2028   Hepatitis C Screening  Completed   HIV Screening  Completed   Hepatitis B Vaccines 19-59 Average Risk  Aged Out   HPV VACCINES  Aged Out   Meningococcal B Vaccine  Aged Out   DTaP/Tdap/Td  Discontinued    Health Maintenance Items Addressed:patient   Additional Screening:  Vision Screening: Recommended annual ophthalmology exams for early detection of glaucoma and other disorders of the eye. Is the patient up to date with their annual eye exam?  Yes    Dental Screening: Recommended annual dental exams for proper oral hygiene  Community Resource Referral / Chronic Care Management: CRR required this visit?  No   CCM required this visit?  No   Plan:    I have personally reviewed and noted the following in the patient's chart:   Medical and social history Use of alcohol, tobacco or illicit drugs  Current medications and supplements including opioid prescriptions. Patient is not currently taking opioid prescriptions. Functional ability and status Nutritional status Physical activity Advanced directives List of other physicians Hospitalizations, surgeries, and ER visits in previous 12 months Vitals Screenings to include cognitive, depression, and falls Referrals and appointments  In addition, I have reviewed and discussed with patient certain preventive protocols, quality metrics, and best practice recommendations. A written personalized care plan for preventive services as well as general preventive health recommendations were provided to patient.   Lyle MARLA Right, [place]   07/22/2024   After Visit Summary: (MyChart) Due to this being a telephonic visit, the after visit summary with patients personalized plan was offered to patient  via MyChart   Notes: Nothing significant to report at this time.

## 2024-07-22 NOTE — Patient Instructions (Signed)
 Brittany Miles,  Thank you for taking the time for your Medicare Wellness Visit. I appreciate your continued commitment to your health goals. Please review the care plan we discussed, and feel free to reach out if I can assist you further.  Medicare recommends these wellness visits once per year to help you and your care team stay ahead of potential health issues. These visits are designed to focus on prevention, allowing your provider to concentrate on managing your acute and chronic conditions during your regular appointments.  Please note that Annual Wellness Visits do not include a physical exam. Some assessments may be limited, especially if the visit was conducted virtually. If needed, we may recommend a separate in-person follow-up with your provider.  Ongoing Care Seeing your primary care provider every 3 to 6 months helps us  monitor your health and provide consistent, personalized care.   Referrals If a referral was made during today's visit and you haven't received any updates within two weeks, please contact the referred provider directly to check on the status.  Recommended Screenings:  Health Maintenance  Topic Date Due   Pap with HPV screening  Never done   Pneumococcal Vaccine for age over 23 (1 of 1 - PCV) Never done   COVID-19 Vaccine (3 - Pfizer risk series) 02/08/2021   Zoster (Shingles) Vaccine (1 of 2) 08/16/2024*   Flu Shot  01/31/2025*   Breast Cancer Screening  05/27/2025   Medicare Annual Wellness Visit  07/22/2025   Colon Cancer Screening  09/21/2028   Hepatitis C Screening  Completed   HIV Screening  Completed   Hepatitis B Vaccine  Aged Out   HPV Vaccine  Aged Out   Meningitis B Vaccine  Aged Out   DTaP/Tdap/Td vaccine  Discontinued  *Topic was postponed. The date shown is not the original due date.       07/22/2024    9:38 AM  Advanced Directives  Does Patient Have a Medical Advance Directive? No  Would patient like information on creating a medical  advance directive? No - Patient declined   Advance Care Planning is important because it: Ensures you receive medical care that aligns with your values, goals, and preferences. Provides guidance to your family and loved ones, reducing the emotional burden of decision-making during critical moments.  Vision: Annual vision screenings are recommended for early detection of glaucoma, cataracts, and diabetic retinopathy. These exams can also reveal signs of chronic conditions such as diabetes and high blood pressure.  Dental: Annual dental screenings help detect early signs of oral cancer, gum disease, and other conditions linked to overall health, including heart disease and diabetes.  Please see the attached documents for additional preventive care recommendations.

## 2024-07-25 NOTE — Progress Notes (Signed)
 Patient was in for consult of AFO brace current brace is approx. 62 years old wear and tear is significant  I will bring casting gear back with me and patient is scheduled to come back for casting on 10/3 will need to check with [facility] to see if covered and if prior shara is needed  Lolita

## 2024-08-05 ENCOUNTER — Ambulatory Visit

## 2024-08-05 DIAGNOSIS — M21371 Foot drop, right foot: Secondary | ICD-10-CM

## 2024-08-08 NOTE — Progress Notes (Signed)
 Patient was casted for L1960 thermoplastic AFO current brace is worn beyond repair and needing to be replaced  PLS trim line with full foot plate  Patient has Right Drop foot  Brace will give suppor tin Medial and lateral aspects and assist with toe off and propulsion in gait helping to reduce risk of fall   Cast send to [place]   [employer] active 20% coins no ded No prior auth needed

## 2024-09-05 ENCOUNTER — Encounter: Payer: Self-pay | Admitting: Radiology

## 2024-09-15 ENCOUNTER — Telehealth: Payer: Self-pay

## 2024-09-15 NOTE — Telephone Encounter (Signed)
 Called patient back to get her scheduled for her brace fitting with [facility]. Was unable to get in contact with the patient and left a VM to have her call us  back to schedule the appointment.

## 2024-09-16 ENCOUNTER — Ambulatory Visit (INDEPENDENT_AMBULATORY_CARE_PROVIDER_SITE_OTHER)

## 2024-09-16 DIAGNOSIS — M1712 Unilateral primary osteoarthritis, left knee: Secondary | ICD-10-CM

## 2024-09-16 DIAGNOSIS — M21371 Foot drop, right foot: Secondary | ICD-10-CM

## 2024-09-16 NOTE — Progress Notes (Signed)
 Patient was present and fit with Right solid AFO  Fit is good, brace provides support in sagittal plane and increases propulsion and toe off helping to correct gait. Cutting foot plate to appropriate length and width needed to accommodate foot gear.   Patient was given wear care and break in instructions and will call if any problems arise  Patient will drop off old brace to have cleaned up and new pad added to foot plate   Lolita Schultze Cped, CFO, CFm

## 2024-09-21 ENCOUNTER — Telehealth: Payer: Self-pay

## 2024-09-21 NOTE — Telephone Encounter (Signed)
 Foot plate was modified on AFO to better fit patient and shoe gear sending back to [place] patient can PU no appt is needed  Lolita Schultze CPed

## 2024-10-04 ENCOUNTER — Ambulatory Visit: Admitting: Podiatry

## 2024-10-18 ENCOUNTER — Ambulatory Visit: Admitting: Family Medicine

## 2024-10-18 ENCOUNTER — Encounter: Payer: Self-pay | Admitting: Family Medicine

## 2024-10-18 ENCOUNTER — Ambulatory Visit: Payer: Self-pay

## 2024-10-18 VITALS — BP 122/86 | HR 77 | Resp 16 | Ht 63.0 in | Wt 197.0 lb

## 2024-10-18 DIAGNOSIS — R051 Acute cough: Secondary | ICD-10-CM | POA: Diagnosis not present

## 2024-10-18 MED ORDER — ALBUTEROL SULFATE HFA 108 (90 BASE) MCG/ACT IN AERS: 1.0000 | INHALATION_SPRAY | Freq: Four times a day (QID) | RESPIRATORY_TRACT | 0 refills | Status: DC | PRN

## 2024-10-18 MED ORDER — METHYLPREDNISOLONE 4 MG PO TBPK: ORAL_TABLET | ORAL | 0 refills | Status: DC

## 2024-10-18 MED ORDER — BENZONATATE 100 MG PO CAPS: 100.0000 mg | ORAL_CAPSULE | Freq: Two times a day (BID) | ORAL | 0 refills | Status: DC | PRN

## 2024-10-18 NOTE — Progress Notes (Signed)
 Acute Office Visit  Subjective:     Patient ID: Brittany Miles, female    DOB: 1961-07-22, 63 y.o.   MRN: [id number]  Chief Complaint  Patient presents with   Cough    X1 week, non-productive    HPI Patient is in today for complaints of cough starting one week ago. She voices cough is dry, non-productive. She does endorses shortness of breath but this only with excessive coughing. She denies dyspnea with exertion. She denies shortness of breath at rest. She denies hx of asthma. She denies hx of tobacco use. She voices she has not tried anything over the counter medications because of fear of allergy of codeine. She has used a cough drop and states some relief.   Review of Systems  Constitutional:  Negative for chills and fever.  HENT:  Negative for congestion, sinus pain and sore throat.   Respiratory:  Positive for cough and shortness of breath.   Cardiovascular:  Negative for chest pain.  Gastrointestinal:  Negative for nausea and vomiting.  Musculoskeletal:  Negative for myalgias.  Neurological:  Negative for dizziness and headaches.        Objective:    BP 122/86   Pulse 77   Resp 16   Ht 5' 3 (1.6 m)   Wt 197 lb (89.4 kg)   SpO2 99%   BMI 34.90 kg/m    Physical Exam Constitutional:      Appearance: Normal appearance. She is not toxic-appearing.  HENT:     Head: Normocephalic and atraumatic.  Cardiovascular:     Rate and Rhythm: Normal rate and regular rhythm.  Pulmonary:     Effort: Pulmonary effort is normal. No respiratory distress.     Breath sounds: Normal breath sounds. No wheezing, rhonchi or rales.  Musculoskeletal:     Cervical back: Normal range of motion and neck supple. No tenderness.  Lymphadenopathy:     Cervical: No cervical adenopathy.  Skin:    General: Skin is warm and dry.  Neurological:     General: No focal deficit present.     Mental Status: She is alert.        Assessment & Plan:   1. Acute cough (Primary) Patient  complains of cough for one week. She describes cough as dry and non-productive. Shortness of breath with excessive coughing spells otherwise denies other associated symptoms. Lungs clear to auscultation. V/s stable.  Medications provided for symptom management including benzonatate  100mg  BID PRN cough, albuterol  1 to 2 puffs every 6 hours PRN shortness of breath and/or cough, and Medrol  dose pack 4mg . Return precautions advised. She states she has an appointment with her PCP next month. Advised to maintain scheduled PCP appointment for re-evaluation and ensure symptoms resolved. She is receptive.   - benzonatate  (TESSALON ) 100 MG capsule; Take 1 capsule (100 mg total) by mouth 2 (two) times daily as needed for cough.  Dispense: 20 capsule; Refill: 0 - albuterol  (VENTOLIN  HFA) 108 (90 Base) MCG/ACT inhaler; Inhale 1-2 puffs into the lungs every 6 (six) hours as needed (shortness of breath or cough).  Dispense: 8 g; Refill: 0 - methylPREDNISolone  (MEDROL  DOSEPAK) 4 MG TBPK tablet; Day 1: Take 8 mg (2 tablets) before breakfast, 4 mg (1 tablet) after lunch, 4 mg (1 tablet) after supper, and 8 mg (2 tablets) at bedtime. Day 2:Take 4 mg (1 tablet) before breakfast, 4 mg (1 tablet) after lunch, 4 mg (1 tablet) after supper, and 8 mg (2 tablets) at bedtime. Day 3:  Take 4 mg (1 tablet) before breakfast, 4 mg (1 tablet) after lunch, 4 mg (1 tablet) after supper, and 4 mg (1 tablet) at bedtime. Day 4: Take 4 mg (1 tablet) before breakfast, 4 mg (1 tablet) after lunch, and 4 mg (1 tablet) at bedtime. Day 5: Take 4 mg (1 tablet) before breakfast and 4 mg (1 tablet) at bedtime. Day 6: Take 4 mg (1 tablet) before breakfast.  Dispense: 1 each; Refill: 0    Return if symptoms worsen or fail to improve.  LAYMON LOISE CORE, FNP

## 2024-10-18 NOTE — Telephone Encounter (Signed)
 FYI Only or Action Required?: FYI only for provider: appointment scheduled on 12/16.  Patient was last seen in primary care on 05/16/2024 by Bernardo Fend, DO.  Called Nurse Triage reporting Cough.  Symptoms began several days ago.  Interventions attempted: Rest, hydration, or home remedies.  Symptoms are: gradually worsening.  Triage Disposition: See Physician Within 24 Hours  Patient/caregiver understands and will follow disposition?: Yes  Reason for Disposition  [1] Continuous (nonstop) coughing interferes with work or school AND [2] no improvement using cough treatment per Care Advice  Answer Assessment - Initial Assessment Questions 1. ONSET: When did the cough begin?      Couple days 2. SEVERITY: How bad is the cough today?      *No Answer* 3. SPUTUM: Describe the color of your sputum (e.g., none, dry cough; clear, white, yellow, green)     no 4. HEMOPTYSIS: Are you coughing up any blood? If Yes, ask: How much? (e.g., flecks, streaks, tablespoons, etc.)     na 5. DIFFICULTY BREATHING: Are you having difficulty breathing? If Yes, ask: How bad is it? (e.g., mild, moderate, severe)      Sometimes gets choked with cough 6. FEVER: Do you have a fever? If Yes, ask: What is your temperature, how was it measured, and when did it start?     no 7. CARDIAC HISTORY: Do you have any history of heart disease? (e.g., heart attack, congestive heart failure)      none 8. LUNG HISTORY: Do you have any history of lung disease?  (e.g., pulmonary embolus, asthma, emphysema)     Hx bronchitis 9. PE RISK FACTORS: Do you have a history of blood clots? (or: recent major surgery, recent prolonged travel, bedridden)     no Patient states she was told first available with provider was January- requesting cough medication- patient given sooner appointment  Protocols used: Cough - Acute Non-Productive-A-AH

## 2024-10-18 NOTE — Telephone Encounter (Signed)
 This RN made first attempt to contact pt with no answer. A voicemail was left with call back number provided.      Copied from CRM #[id number]. Topic: Clinical - Medication Question >> Oct 17, 2024 12:31 PM Willma R wrote: Reason for CRM: Patient currently states she has a cough, has been scheduled for 11/08/24 to be seen, but is requesting a cough medication (no codeine or penicillin - allergic) to be prescribed until she is able to be seen.  Patient can be reached at [phone]

## 2024-10-24 ENCOUNTER — Telehealth: Payer: Self-pay

## 2024-10-24 NOTE — Telephone Encounter (Signed)
 Patient  came into [facility] office today and picked ip her brace. Let pt know if any issues please call us  and let us  know.

## 2024-11-08 ENCOUNTER — Ambulatory Visit: Admitting: Internal Medicine

## 2024-11-08 ENCOUNTER — Other Ambulatory Visit: Payer: Self-pay

## 2024-11-08 ENCOUNTER — Encounter: Payer: Self-pay | Admitting: Internal Medicine

## 2024-11-08 VITALS — BP 130/80 | HR 80 | Temp 98.0°F | Resp 16 | Ht 63.0 in | Wt 194.3 lb

## 2024-11-08 DIAGNOSIS — E559 Vitamin D deficiency, unspecified: Secondary | ICD-10-CM | POA: Diagnosis not present

## 2024-11-08 DIAGNOSIS — E538 Deficiency of other specified B group vitamins: Secondary | ICD-10-CM | POA: Diagnosis not present

## 2024-11-08 DIAGNOSIS — G809 Cerebral palsy, unspecified: Secondary | ICD-10-CM | POA: Diagnosis not present

## 2024-11-08 DIAGNOSIS — E782 Mixed hyperlipidemia: Secondary | ICD-10-CM

## 2024-11-08 DIAGNOSIS — R569 Unspecified convulsions: Secondary | ICD-10-CM | POA: Diagnosis not present

## 2024-11-08 NOTE — Progress Notes (Signed)
 "  Established Patient Office Visit  Subjective    Patient ID: Brittany Miles, female    DOB: 1961-02-22  Age: 64 y.o. MRN: [id number]  CC:  Chief Complaint  Patient presents with   Medical Management of Chronic Issues    HPI Brittany Miles presents to follow up on chronic medical conditions.   Discussed the use of AI scribe software for clinical note transcription with the patient, who gave verbal consent to proceed.  History of Present Illness  Brittany Miles is a 64 year old female who presents for a six-month follow-up and routine blood work.  She reports prior winter coughs related to bronchitis, now resolved with no current respiratory symptoms.  She currently takes Lipitor, Zyrtec , Tegretol , gabapentin, and over-the-counter Pepcid .  She had a mammogram two to three years ago and a colonoscopy in 2019. She recalls a Pap smear around that time, but there is no documentation.  She has not received flu or pneumonia vaccines and is worried vaccines may cause illness, though she has not had seizures from vaccines.   CP/Seizure Disorder: -Following with Dr. Lane, Neurology -No seizure activity recently -Is currently on Gabapentin 800 mg TID and Tegretol  200 mg in the am, 150 mg in the evening  -Has a brace on her right leg, has had it since 2013 and now having some right ankle pain  Vitamin B12 Deficiency:  -Currently on B complex vitamins   Vitamin D  Deficiency: -Currently on 5000 international units   HLD: -Medications: Restarted Lipitor 10 mg at LOV -Patient is compliant with medication and denies side effects -Last lipid panel: Lipid Panel     Component Value Date/Time   CHOL 236 (H) 05/14/2023 1026   TRIG 60 05/14/2023 1026   HDL 80 05/14/2023 1026   CHOLHDL 3.0 05/14/2023 1026   VLDL 7 10/22/2016 1240   LDLCALC 141 (H) 05/14/2023 1026   The 10-year ASCVD risk score (Arnett DK, et al., 2019) is: 3.8%   Values used to calculate the score:     Age: 49  years     Clinically relevant sex: Female     Is Non-Hispanic African American: No     Diabetic: No     Tobacco smoker: No     Systolic Blood Pressure: 130 mmHg     Is BP treated: No     HDL Cholesterol: 80 mg/dL     Total Cholesterol: 236 mg/dL  Health Maintenance: -Blood work due -Mammogram 7/24, due soon will order, patient will call to schedule -Colonoscopy 09/2018, repeat in 10 years    Outpatient Encounter Medications as of 11/08/2024  Medication Sig   albuterol  (VENTOLIN  HFA) 108 (90 Base) MCG/ACT inhaler Inhale 1-2 puffs into the lungs every 6 (six) hours as needed (shortness of breath or cough).   atorvastatin  (LIPITOR) 10 MG tablet Take 1 tablet (10 mg total) by mouth daily.   benzonatate  (TESSALON ) 100 MG capsule Take 1 capsule (100 mg total) by mouth 2 (two) times daily as needed for cough.   carbamazepine  (TEGRETOL ) 200 MG tablet Take 200 mg by mouth 3 (three) times daily. 200 in am 150 at night   cetirizine  (ZYRTEC  ALLERGY) 10 MG tablet Take 1 tablet (10 mg total) by mouth daily.   famotidine  (PEPCID ) 20 MG tablet Take 1 tablet (20 mg total) by mouth 2 (two) times daily.   gabapentin (NEURONTIN) 800 MG tablet Take 800 mg by mouth 3 (three) times daily.   methylPREDNISolone  (MEDROL  DOSEPAK) 4 MG TBPK tablet Day 1:  Take 8 mg (2 tablets) before breakfast, 4 mg (1 tablet) after lunch, 4 mg (1 tablet) after supper, and 8 mg (2 tablets) at bedtime. Day 2:Take 4 mg (1 tablet) before breakfast, 4 mg (1 tablet) after lunch, 4 mg (1 tablet) after supper, and 8 mg (2 tablets) at bedtime. Day 3: Take 4 mg (1 tablet) before breakfast, 4 mg (1 tablet) after lunch, 4 mg (1 tablet) after supper, and 4 mg (1 tablet) at bedtime. Day 4: Take 4 mg (1 tablet) before breakfast, 4 mg (1 tablet) after lunch, and 4 mg (1 tablet) at bedtime. Day 5: Take 4 mg (1 tablet) before breakfast and 4 mg (1 tablet) at bedtime. Day 6: Take 4 mg (1 tablet) before breakfast. (Patient not taking: Reported on 11/08/2024)    No facility-administered encounter medications on file as of 11/08/2024.    Past Medical History:  Diagnosis Date   FH: cerebral palsy    Seizures (HCC)     Past Surgical History:  Procedure Laterality Date   ANKLE SURGERY     as child   COLONOSCOPY WITH PROPOFOL  N/A 09/21/2018   Procedure: COLONOSCOPY WITH PROPOFOL ;  Surgeon: Unk Corinn Skiff, MD;  Location: [facility] ENDOSCOPY;  Service: Gastroenterology;  Laterality: N/A;   dog bite      Family History  Problem Relation Age of Onset   Aneurysm Mother    Cancer Father    Breast cancer Neg Hx     Social History   Socioeconomic History   Marital status: Widowed    Spouse name: Not on file   Number of children: Not on file   Years of education: Not on file   Highest education level: Not on file  Occupational History   Not on file  Tobacco Use   Smoking status: Never   Smokeless tobacco: Never  Vaping Use   Vaping status: Never Used  Substance and Sexual Activity   Alcohol use: No   Drug use: Never   Sexual activity: Yes  Other Topics Concern   Not on file  Social History Narrative   Not on file   Social Drivers of Health   Tobacco Use: Low Risk (11/08/2024)   Patient History    Smoking Tobacco Use: Never    Smokeless Tobacco Use: Never    Passive Exposure: Not on file  Financial Resource Strain: Low Risk (07/17/2023)   Overall Financial Resource Strain (CARDIA)    Difficulty of Paying Living Expenses: Not hard at all  Food Insecurity: No Food Insecurity (07/22/2024)   Epic    Worried About Programme Researcher, Broadcasting/film/video in the Last Year: Never true    Ran Out of Food in the Last Year: Never true  Transportation Needs: No Transportation Needs (07/22/2024)   Epic    Lack of Transportation (Medical): No    Lack of Transportation (Non-Medical): No  Physical Activity: Insufficiently Active (07/22/2024)   Exercise Vital Sign    Days of Exercise per Week: 7 days    Minutes of Exercise per Session: 20 min  Stress: No  Stress Concern Present (07/22/2024)   [employer] of Occupational Health - Occupational Stress Questionnaire    Feeling of Stress: Only a little  Social Connections: Moderately Isolated (07/22/2024)   Social Connection and Isolation Panel    Frequency of Communication with Friends and Family: More than three times a week    Frequency of Social Gatherings with Friends and Family: More than three times a week  Attends Religious Services: 1 to 4 times per year    Active Member of Clubs or Organizations: No    Attends Banker Meetings: Never    Marital Status: Widowed  Intimate Partner Violence: Not At Risk (07/22/2024)   Epic    Fear of Current or Ex-Partner: No    Emotionally Abused: No    Physically Abused: No    Sexually Abused: No  Depression (PHQ2-9): Low Risk (07/22/2024)   Depression (PHQ2-9)    PHQ-2 Score: 0  Alcohol Screen: Low Risk (07/22/2024)   Alcohol Screen    Last Alcohol Screening Score (AUDIT): 0  Housing: Low Risk (07/22/2024)   Epic    Unable to Pay for Housing in the Last Year: No    Number of Times Moved in the Last Year: 0    Homeless in the Last Year: No  Utilities: Not At Risk (07/22/2024)   Epic    Threatened with loss of utilities: No  Health Literacy: Adequate Health Literacy (07/17/2023)   B1300 Health Literacy    Frequency of need for help with medical instructions: Never    Review of Systems  Neurological:  Negative for seizures.  All other systems reviewed and are negative.       Objective    BP 130/80 (Cuff Size: Large)   Pulse 80   Temp 98 F (36.7 C) (Oral)   Resp 16   Ht 5' 3 (1.6 m)   Wt 194 lb 4.8 oz (88.1 kg)   SpO2 99%   BMI 34.42 kg/m   Physical Exam Constitutional:      Appearance: Normal appearance.  HENT:     Head: Normocephalic and atraumatic.     Mouth/Throat:     Mouth: Mucous membranes are moist.     Pharynx: Oropharynx is clear.  Eyes:     Extraocular Movements: Extraocular movements intact.      Conjunctiva/sclera: Conjunctivae normal.     Pupils: Pupils are equal, round, and reactive to light.  Cardiovascular:     Rate and Rhythm: Normal rate and regular rhythm.  Pulmonary:     Effort: Pulmonary effort is normal.     Breath sounds: Normal breath sounds.  Musculoskeletal:     Right lower leg: No edema.     Left lower leg: No edema.     Comments: Has brace on lower right extremity  Skin:    General: Skin is warm and dry.  Neurological:     General: No focal deficit present.     Mental Status: She is alert. Mental status is at baseline.  Psychiatric:        Mood and Affect: Mood normal.        Behavior: Behavior normal.     Last CBC Lab Results  Component Value Date   WBC 4.0 07/05/2020   HGB 12.4 07/05/2020   HCT 37.1 07/05/2020   MCV 86.7 07/05/2020   MCH 29.0 07/05/2020   RDW 12.8 07/05/2020   PLT 259 07/05/2020   Last metabolic panel Lab Results  Component Value Date   GLUCOSE 86 07/05/2020   NA 138 10/05/2020   K 4.2 10/05/2020   CL 97 (L) 10/05/2020   CO2 27 10/05/2020   BUN 8 07/05/2020   CREATININE 0.68 07/05/2020   GFRNONAA 96 07/05/2020   CALCIUM  9.0 07/05/2020   PROT 7.1 07/05/2020   BILITOT 0.3 07/05/2020   AST 22 07/05/2020   ALT 11 07/05/2020   ANIONGAP 8 10/22/2016  Last lipids Lab Results  Component Value Date   CHOL 236 (H) 05/14/2023   HDL 80 05/14/2023   LDLCALC 141 (H) 05/14/2023   TRIG 60 05/14/2023   CHOLHDL 3.0 05/14/2023   Last hemoglobin A1c No results found for: HGBA1C Last thyroid  functions Lab Results  Component Value Date   TSH 2.60 07/05/2020   Last vitamin D  No results found for: 25OHVITD2, 25OHVITD3, VD25OH Last vitamin B12 and Folate No results found for: VITAMINB12, FOLATE      Assessment & Plan:   Assessment & Plan  Cerebral Palsy Routine wellness visit focused on preventive care and screenings. Discussed pneumonia vaccine benefits and addressed concerns about side effects. -  Ordered blood work: kidney function, liver function, electrolytes, anemia screening, cholesterol, vitamin D , vitamin B12, Tegretol  level. - Provided information on scheduling a mammogram. - Discussed pneumonia vaccine (Prevnar 20) and offered information for further research. - Will schedule Pap smear and well woman exam in six months.  Mixed hyperlipidemia Managed with Lipitor. - Recheck lipid panel.   Seizure disorder Managed with Tegretol  and gabapentin by Dr. Lane. - Recheck CBC, CMP and tegretol  levels today.   Vitamin D  deficiency Monitored with vitamin D  level in blood work.  Vitamin B12 deficiency Monitored with vitamin B12 level in blood work.  - Lipid Profile - CBC w/Diff/Platelet - Comprehensive Metabolic Panel (CMET) - Carbamazepine  Level (Tegretol ), total - Vitamin D  (25 hydroxy) - Vitamin B12  Return in about 6 months (around 05/08/2025) for physical w/Pap.   Sharyle Fischer, DO   "

## 2024-11-09 ENCOUNTER — Ambulatory Visit: Payer: Self-pay | Admitting: Internal Medicine

## 2024-11-09 LAB — COMPREHENSIVE METABOLIC PANEL WITH GFR
AG Ratio: 1.6 (calc) (ref 1.0–2.5)
ALT: 10 U/L (ref 6–29)
AST: 20 U/L (ref 10–35)
Albumin: 4.4 g/dL (ref 3.6–5.1)
Alkaline phosphatase (APISO): 118 U/L (ref 37–153)
BUN: 8 mg/dL (ref 7–25)
CO2: 29 mmol/L (ref 20–32)
Calcium: 9.4 mg/dL (ref 8.6–10.4)
Chloride: 93 mmol/L — ABNORMAL LOW (ref 98–110)
Creat: 0.69 mg/dL (ref 0.50–1.05)
Globulin: 2.7 g/dL (ref 1.9–3.7)
Glucose, Bld: 87 mg/dL (ref 65–99)
Potassium: 4.3 mmol/L (ref 3.5–5.3)
Sodium: 130 mmol/L — ABNORMAL LOW (ref 135–146)
Total Bilirubin: 0.4 mg/dL (ref 0.2–1.2)
Total Protein: 7.1 g/dL (ref 6.1–8.1)
eGFR: 98 mL/min/1.73m2

## 2024-11-09 LAB — CBC WITH DIFFERENTIAL/PLATELET
Absolute Lymphocytes: 1406 {cells}/uL (ref 850–3900)
Absolute Monocytes: 360 {cells}/uL (ref 200–950)
Basophils Absolute: 62 {cells}/uL (ref 0–200)
Basophils Relative: 1.3 %
Eosinophils Absolute: 62 {cells}/uL (ref 15–500)
Eosinophils Relative: 1.3 %
HCT: 39.2 % (ref 35.9–46.0)
Hemoglobin: 13 g/dL (ref 11.7–15.5)
MCH: 28.6 pg (ref 27.0–33.0)
MCHC: 33.2 g/dL (ref 31.6–35.4)
MCV: 86.3 fL (ref 81.4–101.7)
MPV: 10.1 fL (ref 7.5–12.5)
Monocytes Relative: 7.5 %
Neutro Abs: 2909 {cells}/uL (ref 1500–7800)
Neutrophils Relative %: 60.6 %
Platelets: 297 Thousand/uL (ref 140–400)
RBC: 4.54 Million/uL (ref 3.80–5.10)
RDW: 12.7 % (ref 11.0–15.0)
Total Lymphocyte: 29.3 %
WBC: 4.8 Thousand/uL (ref 3.8–10.8)

## 2024-11-09 LAB — VITAMIN D 25 HYDROXY (VIT D DEFICIENCY, FRACTURES): Vit D, 25-Hydroxy: 39 ng/mL (ref 30–100)

## 2024-11-09 LAB — VITAMIN B12: Vitamin B-12: 1203 pg/mL — ABNORMAL HIGH (ref 200–1100)

## 2024-11-09 LAB — LIPID PANEL
Cholesterol: 224 mg/dL — ABNORMAL HIGH
HDL: 101 mg/dL
LDL Cholesterol (Calc): 112 mg/dL — ABNORMAL HIGH
Non-HDL Cholesterol (Calc): 123 mg/dL
Total CHOL/HDL Ratio: 2.2 (calc)
Triglycerides: 38 mg/dL

## 2024-11-09 LAB — CARBAMAZEPINE LEVEL, TOTAL: Carbamazepine Lvl: 9.9 mg/L (ref 4.0–12.0)

## 2024-11-10 ENCOUNTER — Other Ambulatory Visit: Payer: Self-pay | Admitting: Internal Medicine

## 2024-11-10 DIAGNOSIS — E782 Mixed hyperlipidemia: Secondary | ICD-10-CM

## 2024-11-10 MED ORDER — ATORVASTATIN CALCIUM 20 MG PO TABS: 20.0000 mg | ORAL_TABLET | Freq: Every day | ORAL | 1 refills | Status: AC

## 2024-11-16 ENCOUNTER — Ambulatory Visit (INDEPENDENT_AMBULATORY_CARE_PROVIDER_SITE_OTHER): Admitting: Podiatrist

## 2024-11-16 DIAGNOSIS — M21372 Foot drop, left foot: Secondary | ICD-10-CM

## 2024-11-16 NOTE — Progress Notes (Signed)
 Brittany Miles presents today for an AFO slight modification.  She would like the location of the velco strap and slide ring swapped so the strap is on the right and slide on her left of the AFO for easier donning for her.  I sent the AFO back and asked for this modification When back, we will call Brittany Miles for a pick up-  she does not need an appointment fitting and she would also prefer to pick up in our [place] location.

## 2025-05-09 ENCOUNTER — Encounter: Admitting: Internal Medicine

## 2025-05-17 ENCOUNTER — Ambulatory Visit: Admitting: Internal Medicine
